# Patient Record
Sex: Female | Born: 1970 | Race: Black or African American | Hispanic: No | Marital: Single | Smoking: Never smoker
Health system: Southern US, Community
[De-identification: ages and names within clinical notes are randomized; demographics above are authoritative.]

## PROBLEM LIST (undated history)

## (undated) DIAGNOSIS — Z8619 Personal history of other infectious and parasitic diseases: Secondary | ICD-10-CM

## (undated) DIAGNOSIS — N760 Acute vaginitis: Secondary | ICD-10-CM

## (undated) DIAGNOSIS — R87619 Unspecified abnormal cytological findings in specimens from cervix uteri: Secondary | ICD-10-CM

## (undated) DIAGNOSIS — A5901 Trichomonal vulvovaginitis: Secondary | ICD-10-CM

## (undated) DIAGNOSIS — IMO0002 Reserved for concepts with insufficient information to code with codable children: Secondary | ICD-10-CM

## (undated) DIAGNOSIS — B9689 Other specified bacterial agents as the cause of diseases classified elsewhere: Secondary | ICD-10-CM

## (undated) HISTORY — DX: Acute vaginitis: N76.0

## (undated) HISTORY — DX: Trichomonal vulvovaginitis: A59.01

## (undated) HISTORY — DX: Other specified bacterial agents as the cause of diseases classified elsewhere: B96.89

## (undated) HISTORY — DX: Unspecified abnormal cytological findings in specimens from cervix uteri: R87.619

## (undated) HISTORY — DX: Reserved for concepts with insufficient information to code with codable children: IMO0002

## (undated) HISTORY — DX: Personal history of other infectious and parasitic diseases: Z86.19

---

## 1998-02-17 ENCOUNTER — Inpatient Hospital Stay (HOSPITAL_COMMUNITY): Admission: EM | Admit: 1998-02-17 | Discharge: 1998-02-17 | Payer: Self-pay | Admitting: *Deleted

## 1998-05-14 ENCOUNTER — Inpatient Hospital Stay (HOSPITAL_COMMUNITY): Admission: RE | Admit: 1998-05-14 | Discharge: 1998-05-14 | Payer: Self-pay | Admitting: *Deleted

## 1998-08-11 ENCOUNTER — Inpatient Hospital Stay (HOSPITAL_COMMUNITY): Admission: AD | Admit: 1998-08-11 | Discharge: 1998-08-11 | Payer: Self-pay | Admitting: *Deleted

## 1998-11-11 ENCOUNTER — Inpatient Hospital Stay (HOSPITAL_COMMUNITY): Admission: AD | Admit: 1998-11-11 | Discharge: 1998-11-11 | Payer: Self-pay | Admitting: *Deleted

## 1999-02-26 ENCOUNTER — Inpatient Hospital Stay (HOSPITAL_COMMUNITY): Admission: AD | Admit: 1999-02-26 | Discharge: 1999-02-26 | Payer: Self-pay | Admitting: *Deleted

## 1999-05-21 ENCOUNTER — Inpatient Hospital Stay (HOSPITAL_COMMUNITY): Admission: AD | Admit: 1999-05-21 | Discharge: 1999-05-21 | Payer: Self-pay | Admitting: *Deleted

## 1999-08-24 ENCOUNTER — Inpatient Hospital Stay (HOSPITAL_COMMUNITY): Admission: AD | Admit: 1999-08-24 | Discharge: 1999-08-24 | Payer: Self-pay | Admitting: *Deleted

## 2000-01-01 ENCOUNTER — Inpatient Hospital Stay (HOSPITAL_COMMUNITY): Admission: EM | Admit: 2000-01-01 | Discharge: 2000-01-01 | Payer: Self-pay | Admitting: *Deleted

## 2000-01-18 ENCOUNTER — Inpatient Hospital Stay (HOSPITAL_COMMUNITY): Admission: AD | Admit: 2000-01-18 | Discharge: 2000-01-18 | Payer: Self-pay | Admitting: *Deleted

## 2000-04-29 ENCOUNTER — Inpatient Hospital Stay (HOSPITAL_COMMUNITY): Admission: AD | Admit: 2000-04-29 | Discharge: 2000-04-29 | Payer: Self-pay | Admitting: *Deleted

## 2000-07-06 ENCOUNTER — Inpatient Hospital Stay (HOSPITAL_COMMUNITY): Admission: AD | Admit: 2000-07-06 | Discharge: 2000-07-06 | Payer: Self-pay | Admitting: *Deleted

## 2000-10-03 ENCOUNTER — Inpatient Hospital Stay (HOSPITAL_COMMUNITY): Admission: AD | Admit: 2000-10-03 | Discharge: 2000-10-03 | Payer: Self-pay | Admitting: *Deleted

## 2000-12-26 ENCOUNTER — Inpatient Hospital Stay (HOSPITAL_COMMUNITY): Admission: AD | Admit: 2000-12-26 | Discharge: 2000-12-26 | Payer: Self-pay | Admitting: *Deleted

## 2001-03-21 ENCOUNTER — Inpatient Hospital Stay (HOSPITAL_COMMUNITY): Admission: AD | Admit: 2001-03-21 | Discharge: 2001-03-21 | Payer: Self-pay | Admitting: *Deleted

## 2002-03-27 ENCOUNTER — Other Ambulatory Visit: Admission: RE | Admit: 2002-03-27 | Discharge: 2002-03-27 | Payer: Self-pay | Admitting: Obstetrics and Gynecology

## 2002-11-01 DIAGNOSIS — B9689 Other specified bacterial agents as the cause of diseases classified elsewhere: Secondary | ICD-10-CM

## 2002-11-01 HISTORY — DX: Other specified bacterial agents as the cause of diseases classified elsewhere: B96.89

## 2003-04-17 ENCOUNTER — Other Ambulatory Visit: Admission: RE | Admit: 2003-04-17 | Discharge: 2003-04-17 | Payer: Self-pay | Admitting: Obstetrics and Gynecology

## 2003-12-28 ENCOUNTER — Emergency Department (HOSPITAL_COMMUNITY): Admission: EM | Admit: 2003-12-28 | Discharge: 2003-12-28 | Payer: Self-pay | Admitting: Emergency Medicine

## 2004-03-31 ENCOUNTER — Emergency Department (HOSPITAL_COMMUNITY): Admission: EM | Admit: 2004-03-31 | Discharge: 2004-03-31 | Payer: Self-pay | Admitting: Emergency Medicine

## 2004-04-03 ENCOUNTER — Emergency Department (HOSPITAL_COMMUNITY): Admission: EM | Admit: 2004-04-03 | Discharge: 2004-04-03 | Payer: Self-pay | Admitting: Emergency Medicine

## 2004-11-01 DIAGNOSIS — A5901 Trichomonal vulvovaginitis: Secondary | ICD-10-CM

## 2004-11-01 HISTORY — DX: Trichomonal vulvovaginitis: A59.01

## 2004-11-05 ENCOUNTER — Other Ambulatory Visit: Admission: RE | Admit: 2004-11-05 | Discharge: 2004-11-05 | Payer: Self-pay | Admitting: Obstetrics and Gynecology

## 2005-11-16 ENCOUNTER — Other Ambulatory Visit: Admission: RE | Admit: 2005-11-16 | Discharge: 2005-11-16 | Payer: Self-pay | Admitting: Obstetrics and Gynecology

## 2006-04-08 ENCOUNTER — Emergency Department (HOSPITAL_COMMUNITY): Admission: EM | Admit: 2006-04-08 | Discharge: 2006-04-08 | Payer: Self-pay | Admitting: Emergency Medicine

## 2009-07-02 HISTORY — PX: SPHINCTEROTOMY: SHX5279

## 2009-07-06 ENCOUNTER — Ambulatory Visit: Payer: Self-pay | Admitting: Diagnostic Radiology

## 2009-07-06 ENCOUNTER — Emergency Department (HOSPITAL_BASED_OUTPATIENT_CLINIC_OR_DEPARTMENT_OTHER): Admission: EM | Admit: 2009-07-06 | Discharge: 2009-07-06 | Payer: Self-pay | Admitting: Emergency Medicine

## 2009-07-16 ENCOUNTER — Encounter: Payer: Self-pay | Admitting: Emergency Medicine

## 2009-07-16 ENCOUNTER — Ambulatory Visit: Payer: Self-pay | Admitting: Radiology

## 2009-07-16 ENCOUNTER — Inpatient Hospital Stay (HOSPITAL_COMMUNITY): Admission: AD | Admit: 2009-07-16 | Discharge: 2009-07-17 | Payer: Self-pay | Admitting: Gastroenterology

## 2010-01-22 ENCOUNTER — Ambulatory Visit: Payer: Self-pay | Admitting: Family Medicine

## 2010-01-22 LAB — CONVERTED CEMR LAB
Beta hcg, urine, semiquantitative: NEGATIVE
Ketones, urine, test strip: NEGATIVE
Nitrite: NEGATIVE
Protein, U semiquant: 30
Urobilinogen, UA: 1
WBC Urine, dipstick: NEGATIVE

## 2010-01-23 ENCOUNTER — Encounter: Payer: Self-pay | Admitting: Family Medicine

## 2010-01-26 ENCOUNTER — Telehealth: Payer: Self-pay | Admitting: Family Medicine

## 2010-12-01 NOTE — Progress Notes (Signed)
  Phone Note Call from Patient   Caller: Patient Reason for Call: Lab or Test Results Summary of Call: PATIENT HAS NOT HEARD ANYTHING FROM LABS. PLEASE CALL AT [phone]. [place]  Initial call taken by: Dannette Barbara,  January 26, 2010 2:16 PM     Notify wet prep negative; GC/chlamydia still pending Donna Christen MD  January 26, 2010 7:34 PM   Pt notified. Areta Haber CMA  January 26, 2010 7:39 PM

## 2010-12-01 NOTE — Assessment & Plan Note (Signed)
Summary: discharge, itching x 1 wk rm 3   Vital Signs:  Patient Profile:   40 Years Old Female CC:      irritation & discharge x 1 week Height:     69 inches Weight:      153 pounds O2 Sat:      100 % O2 treatment:    Room Air Temp:     98.4 degrees F oral Pulse rate:   78 / minute Pulse rhythm:   regular Resp:     16 per minute BP sitting:   98 / 75  (right arm) Cuff size:   regular  Vitals Entered By: Areta Haber CMA (January 22, 2010 5:27 PM)                  Current Allergies: No known allergies History of Present Illness Chief Complaint: irritation & discharge x 1 week History of Present Illness: Patient reports vaginal irritation and itching for about one week not associated with abdominal pain or urinary symptoms.  Reports she used OTC Monistat two days ago for symptoms without relief of symptoms.  Last unprotected intercourse two weeks ago.  No fever, n/v or known symptoms in partner.  Current Problems: VAGINITIS (ICD-616.10) FAMILY HISTORY OF ASTHMA (ICD-V17.5)   Current Meds DEPO-SUBQ PROVERA 104 104 MG/0.65ML SUSP (MEDROXYPROGESTERONE ACETATE) injection q 3 mths METROGEL-VAGINAL 0.75 % GEL (METRONIDAZOLE) 1 applicatorful inserted in vagina two times a day for five days AZITHROMYCIN 1 GM PACK (AZITHROMYCIN) take all tabs-single dose SUPRAX 400 MG TABS (CEFIXIME) take one tab by mouth x1  REVIEW OF SYSTEMS Constitutional Symptoms      Denies fever, chills, night sweats, weight loss, weight gain, and fatigue.  Eyes       Denies change in vision, eye pain, eye discharge, glasses, contact lenses, and eye surgery. Ear/Nose/Throat/Mouth       Denies hearing loss/aids, change in hearing, ear pain, ear discharge, dizziness, frequent runny nose, frequent nose bleeds, sinus problems, sore throat, hoarseness, and tooth pain or bleeding.  Respiratory       Denies dry cough, productive cough, wheezing, shortness of breath, asthma, bronchitis, and emphysema/COPD.    Cardiovascular       Denies murmurs, chest pain, and tires easily with exhertion.    Gastrointestinal       Denies stomach pain, nausea/vomiting, diarrhea, constipation, blood in bowel movements, and indigestion. Genitourniary       Complains of blood or discharge from vagina.      Denies painful urination, kidney stones, and loss of urinary control.      Comments: no odor x 1 wk Neurological       Denies paralysis, seizures, and fainting/blackouts. Musculoskeletal       Denies muscle pain, joint pain, joint stiffness, decreased range of motion, redness, swelling, muscle weakness, and gout.  Skin       Denies bruising, unusual mles/lumps or sores, and hair/skin or nail changes.  Psych       Denies mood changes, temper/anger issues, anxiety/stress, speech problems, depression, and sleep problems.  Past History:  Past Medical History: Unremarkable  Past Surgical History: Denies surgical history  Family History:   Family History of Asthma  Social History: Single Never Smoked Alcohol use-no Drug use-no Regular exercise-yes Smoking Status:  never Drug Use:  no Does Patient Exercise:  yes Physical Exam General appearance: well developed, well nourished, no acute distress Oral/Pharynx: tongue normal, posterior pharynx without erythema or exudate Neck: neck supple,  trachea midline,  no masses or lymphadenopathy Chest/Lungs: no rales, wheezes, or rhonchi bilateral, breath sounds equal without effort Heart: regular rate and  rhythm, no murmur Abdomen: soft, non-tender without obvious organomegaly GU: normal , no cva tenderness Skin: no obvious rashes or lesions pelvic exam with specimens sent to lab, small to moderate amount of thin white discharge, no cervical or adnexal tenderness, vaginal introitus with mild irritation. Assessment New Problems: VAGINITIS (ICD-616.10) FAMILY HISTORY OF ASTHMA (ICD-V17.5)   Plan New Medications/Changes: AZITHROMYCIN 1 GM PACK  (AZITHROMYCIN) take all tabs-single dose  #1 x 0, 01/22/2010, Acelin Ferdig DO SUPRAX 400 MG TABS (CEFIXIME) take one tab by mouth x1  #1 x 0, 01/22/2010, Marvis Moeller DO METROGEL-VAGINAL 0.75 % GEL (METRONIDAZOLE) 1 applicatorful inserted in vagina two times a day for five days  #t tube x 0, 01/22/2010, Marvis Moeller DO  New Orders: New Patient Level III [99203] T- GC Chlamydia [16109] T-Wet Prep by [facility] [60454-09811] Urine Pregnancy Test  [81025] Urinalysis [81003-65000]   Prescriptions: AZITHROMYCIN 1 GM PACK (AZITHROMYCIN) take all tabs-single dose  #1 x 0   Entered and Authorized by:   Marvis Moeller DO   Signed by:   Marvis Moeller DO on 01/22/2010   Method used:   Print then Give to Patient   RxID:   [id number] SUPRAX 400 MG TABS (CEFIXIME) take one tab by mouth x1  #1 x 0   Entered and Authorized by:   Marvis Moeller DO   Signed by:   Marvis Moeller DO on 01/22/2010   Method used:   Print then Give to Patient   RxID:   [id number] METROGEL-VAGINAL 0.75 % GEL (METRONIDAZOLE) 1 applicatorful inserted in vagina two times a day for five days  #t tube x 0   Entered and Authorized by:   Marvis Moeller DO   Signed by:   Marvis Moeller DO on 01/22/2010   Method used:   Print then Give to Patient   RxID:   [id number]   Patient Instructions: 1)  Take medications as prescribed. 2)  Abstain from intercourse for seven days. 3)  Await results of cultures. 4)  Use condoms for STD prevention. 5)  Return if symptoms worsen or persist with treatment.  Laboratory Results   Urine Tests  Date/Time Received: January 22, 2010 5:54 PM  Date/Time Reported: January 22, 2010 5:55 PM   Routine Urinalysis   Color: yellow Appearance: Clear Glucose: negative   (Normal Range: Negative) Bilirubin: negative   (Normal Range: Negative) Ketone: negative   (Normal Range: Negative) Spec. Gravity: 1.020   (Normal Range: 1.003-1.035) Blood: negative   (Normal  Range: Negative) pH: 7.0   (Normal Range: 5.0-8.0) Protein: 30   (Normal Range: Negative) Urobilinogen: 1.0   (Normal Range: 0-1) Nitrite: negative   (Normal Range: Negative) Leukocyte Esterace: negative   (Normal Range: Negative)    Urine HCG: negative

## 2011-02-05 LAB — COMPREHENSIVE METABOLIC PANEL
ALT: 242 U/L — ABNORMAL HIGH (ref 0–35)
AST: 85 U/L — ABNORMAL HIGH (ref 0–37)
AST: 142 U/L — ABNORMAL HIGH (ref 0–37)
Albumin: 3.4 g/dL — ABNORMAL LOW (ref 3.5–5.2)
Albumin: 4.3 g/dL (ref 3.5–5.2)
Alkaline Phosphatase: 168 U/L — ABNORMAL HIGH (ref 39–117)
Alkaline Phosphatase: 204 U/L — ABNORMAL HIGH (ref 39–117)
BUN: 3 mg/dL — ABNORMAL LOW (ref 6–23)
BUN: 14 mg/dL (ref 6–23)
BUN: 9 mg/dL (ref 6–23)
CO2: 30 mEq/L (ref 19–32)
Calcium: 9.4 mg/dL (ref 8.4–10.5)
Chloride: 105 mEq/L (ref 96–112)
Chloride: 106 mEq/L (ref 96–112)
Creatinine, Ser: 0.72 mg/dL (ref 0.4–1.2)
GFR calc Af Amer: >60 mL/min (ref >60)
GFR calc non Af Amer: >60 mL/min (ref >60)
Glucose, Bld: 85 mg/dL (ref 70–99)
Potassium: 3.6 mEq/L (ref 3.5–5.1)
Potassium: 3.7 mEq/L (ref 3.5–5.1)
Sodium: 144 mEq/L (ref 135–145)
Total Bilirubin: 1.3 mg/dL — ABNORMAL HIGH (ref 0.3–1.2)
Total Bilirubin: 1.5 mg/dL — ABNORMAL HIGH (ref 0.3–1.2)
Total Protein: 6.7 g/dL (ref 6.0–8.3)
Total Protein: 8.3 g/dL (ref 6.0–8.3)

## 2011-02-05 LAB — DIFFERENTIAL
Basophils Absolute: 0 10*3/uL (ref 0.0–0.1)
Basophils Absolute: 0 10*3/uL (ref 0.0–0.1)
Eosinophils Absolute: 0.1 10*3/uL (ref 0.0–0.7)
Eosinophils Absolute: 0.1 10*3/uL (ref 0.0–0.7)
Lymphocytes Relative: 34 % (ref 12–46)
Lymphs Abs: 2.8 10*3/uL (ref 0.7–4.0)
Lymphs Abs: 3.7 10*3/uL (ref 0.7–4.0)
Monocytes Absolute: 1.1 10*3/uL — ABNORMAL HIGH (ref 0.1–1.0)
Neutro Abs: 4.6 10*3/uL (ref 1.7–7.7)
Neutro Abs: 6 10*3/uL (ref 1.7–7.7)

## 2011-02-05 LAB — URINALYSIS, ROUTINE W REFLEX MICROSCOPIC
Glucose, UA: NEGATIVE mg/dL
Hgb urine dipstick: NEGATIVE
Hgb urine dipstick: NEGATIVE
Ketones, ur: 15 mg/dL — AB
Ketones, ur: 15 mg/dL — AB
Nitrite: NEGATIVE
Protein, ur: NEGATIVE mg/dL
Specific Gravity, Urine: 1.039 — ABNORMAL HIGH (ref 1.005–1.030)
Urobilinogen, UA: 2 mg/dL — ABNORMAL HIGH (ref 0.0–1.0)
Urobilinogen, UA: 2 mg/dL — ABNORMAL HIGH (ref 0.0–1.0)
pH: 5.5 (ref 5.0–8.0)

## 2011-02-05 LAB — CBC
HCT: 35 % — ABNORMAL LOW (ref 36.0–46.0)
HCT: 36.8 % (ref 36.0–46.0)
HCT: 38.7 % (ref 36.0–46.0)
Hemoglobin: 12.3 g/dL (ref 12.0–15.0)
MCHC: 32.2 g/dL (ref 30.0–36.0)
MCHC: 31.9 g/dL (ref 30.0–36.0)
MCV: 71.7 fL — ABNORMAL LOW (ref 78.0–100.0)
Platelets: 136 10*3/uL — ABNORMAL LOW (ref 150–400)
Platelets: 185 10*3/uL (ref 150–400)
Platelets: 216 10*3/uL (ref 150–400)
RBC: 5.09 MIL/uL (ref 3.87–5.11)
RDW: 15.3 % (ref 11.5–15.5)
RDW: 14.6 % (ref 11.5–15.5)
WBC: 5.2 10*3/uL (ref 4.0–10.5)
WBC: 8.2 10*3/uL (ref 4.0–10.5)

## 2011-02-05 LAB — POCT CARDIAC MARKERS

## 2011-02-05 LAB — URINE MICROSCOPIC-ADD ON

## 2011-02-05 LAB — LIPASE, BLOOD: Lipase: 121 U/L (ref 23–300)

## 2011-02-05 LAB — PREGNANCY, URINE: Preg Test, Ur: NEGATIVE

## 2011-06-05 ENCOUNTER — Encounter: Payer: Self-pay | Admitting: Family Medicine

## 2011-06-07 ENCOUNTER — Ambulatory Visit: Payer: Self-pay | Admitting: Family Medicine

## 2011-06-07 DIAGNOSIS — Z0289 Encounter for other administrative examinations: Secondary | ICD-10-CM

## 2011-10-21 ENCOUNTER — Encounter: Payer: Self-pay | Admitting: *Deleted

## 2011-10-21 ENCOUNTER — Emergency Department (INDEPENDENT_AMBULATORY_CARE_PROVIDER_SITE_OTHER)
Admission: EM | Admit: 2011-10-21 | Discharge: 2011-10-21 | Disposition: A | Discharge status: Home / Self Care | Payer: BC Managed Care – PPO | Source: Home / Self Care | Attending: Emergency Medicine | Admitting: Emergency Medicine

## 2011-10-21 DIAGNOSIS — J209 Acute bronchitis, unspecified: Secondary | ICD-10-CM

## 2011-10-21 DIAGNOSIS — R05 Cough: Secondary | ICD-10-CM

## 2011-10-21 DIAGNOSIS — R059 Cough, unspecified: Secondary | ICD-10-CM

## 2011-10-21 MED ORDER — AZITHROMYCIN 250 MG PO TABS: ORAL_TABLET | ORAL | Status: AC

## 2011-10-21 MED ORDER — GUAIFENESIN-CODEINE 100-10 MG/5ML PO SYRP: 5.0000 mL | ORAL_SOLUTION | Freq: Four times a day (QID) | ORAL | Status: AC | PRN

## 2011-10-21 NOTE — ED Provider Notes (Signed)
History     CSN: [id number]  Arrival date & time 10/21/11  1004   First MD Initiated Contact with Patient 10/21/11 1009      Chief Complaint  Patient presents with  . Cough    (Consider location/radiation/quality/duration/timing/severity/associated sxs/prior treatment) HPI Heidi Anderson is a 40 y.o. female who complains of onset of cold symptoms for  a week .  No sore throat ++ cough No pleuritic pain No wheezing + nasal congestion + post-nasal drainage + sinus pain/pressure No chest congestion + itchy/red eyes No earache No hemoptysis No SOB No chills/sweats No fever No nausea No vomiting No abdominal pain No diarrhea No skin rashes No fatigue No myalgias No headache    History reviewed. No pertinent past medical history.  Past Surgical History  Procedure Date  . Cesarean section     Family History  Problem Relation Age of Onset  . Asthma      family history    History  Substance Use Topics  . Smoking status: Never Smoker   . Smokeless tobacco: Not on file  . Alcohol Use: No    OB History    Grav Para Term Preterm Abortions TAB SAB Ect Mult Living                  Review of Systems  Allergies  Review of patient's allergies indicates no known allergies.  Home Medications   Current Outpatient Rx  Name Route Sig Dispense Refill  . AZITHROMYCIN 250 MG PO TABS  Use as directed 1 each 0  . GUAIFENESIN-CODEINE 100-10 MG/5ML PO SYRP Oral Take 5 mLs by mouth 4 (four) times daily as needed for cough or congestion. 120 mL 0  . MEDROXYPROGESTERONE ACETATE 104 MG/0.65ML [place] SUSP Subcutaneous Inject 104 mg into the skin every 3 (three) months.        BP 112/72  Pulse 90  Temp(Src) 98.1 F (36.7 C) (Oral)  Resp 14  Ht 5\' 9"  (1.753 m)  Wt 165 lb (74.844 kg)  BMI 24.37 kg/m2  SpO2 97%  Physical Exam  Nursing note and vitals reviewed. Constitutional: She is oriented to person, place, and time. She appears well-developed and well-nourished. She  appears distressed (actively coughing throughout the exam).  HENT:  Head: Normocephalic and atraumatic.  Right Ear: Tympanic membrane, external ear and ear canal normal.  Left Ear: Tympanic membrane, external ear and ear canal normal.  Nose: Mucosal edema and rhinorrhea present.  Mouth/Throat: Posterior oropharyngeal erythema present. No oropharyngeal exudate or posterior oropharyngeal edema.  Eyes: No scleral icterus.  Neck: Neck supple.  Cardiovascular: Regular rhythm and normal heart sounds.   Pulmonary/Chest: Effort normal and breath sounds normal. No respiratory distress.  Neurological: She is alert and oriented to person, place, and time.  Skin: Skin is warm and dry.  Psychiatric: She has a normal mood and affect. Her speech is normal.    ED Course  Procedures (including critical care time)  Labs Reviewed - No data to display No results found.   1. Cough   2. Acute bronchitis       MDM  1)  Take the prescribed antibiotic as instructed.  If not improving, consider Tessalon Perles. 2)  Use nasal saline solution (over the counter) at least 3 times a day. 3)  Use over the counter decongestants like Zyrtec-D every 12 hours as needed to help with congestion.  If you have hypertension, do not take medicines with sudafed.  4)  Can take tylenol every 6  hours or motrin every 8 hours for pain or fever. 5)  Follow up with your primary doctor if no improvement in 5-7 days, sooner if increasing pain, fever, or new symptoms.      Lily Kocher, MD 10/21/11 [phone]

## 2011-10-21 NOTE — ED Notes (Signed)
Patient c/o dry cough, mildly productive @ xs. She has used Catering manager and delsym OTC. Denies fever.

## 2012-02-11 ENCOUNTER — Other Ambulatory Visit (INDEPENDENT_AMBULATORY_CARE_PROVIDER_SITE_OTHER): Payer: BC Managed Care – PPO

## 2012-02-11 DIAGNOSIS — Z3009 Encounter for other general counseling and advice on contraception: Secondary | ICD-10-CM

## 2012-02-11 MED ORDER — MEDROXYPROGESTERONE ACETATE 150 MG/ML IM SUSP
150.0000 mg | Freq: Once | INTRAMUSCULAR | Status: AC
  Administered 2012-02-11: 150 mg via INTRAMUSCULAR

## 2012-02-11 NOTE — Progress Notes (Signed)
Next Depo Due 05-05-12

## 2012-02-14 ENCOUNTER — Other Ambulatory Visit: Payer: Self-pay

## 2012-05-05 ENCOUNTER — Other Ambulatory Visit (INDEPENDENT_AMBULATORY_CARE_PROVIDER_SITE_OTHER): Payer: BC Managed Care – PPO

## 2012-05-05 DIAGNOSIS — Z3040 Encounter for surveillance of contraceptives, unspecified: Secondary | ICD-10-CM

## 2012-05-05 MED ORDER — MEDROXYPROGESTERONE ACETATE 150 MG/ML IM SUSP
150.0000 mg | Freq: Once | INTRAMUSCULAR | Status: AC
  Administered 2012-05-05: 150 mg via INTRAMUSCULAR

## 2012-05-05 NOTE — Progress Notes (Unsigned)
Next depo due 07/27/2012

## 2012-07-24 ENCOUNTER — Other Ambulatory Visit (INDEPENDENT_AMBULATORY_CARE_PROVIDER_SITE_OTHER): Payer: BC Managed Care – PPO

## 2012-07-24 DIAGNOSIS — Z3009 Encounter for other general counseling and advice on contraception: Secondary | ICD-10-CM

## 2012-07-24 MED ORDER — MEDROXYPROGESTERONE ACETATE 150 MG/ML IM SUSP
150.0000 mg | Freq: Once | INTRAMUSCULAR | Status: AC
  Administered 2012-07-24: 150 mg via INTRAMUSCULAR

## 2012-07-24 NOTE — Progress Notes (Unsigned)
Next Depo due 10-15-2012 

## 2012-07-27 ENCOUNTER — Other Ambulatory Visit: Payer: BC Managed Care – PPO

## 2012-10-03 ENCOUNTER — Telehealth: Payer: Self-pay

## 2012-10-03 NOTE — Telephone Encounter (Signed)
Per protocol, Depo Provera 150 mg/ml x 1 called to [facility]. Pt is due for injection on 10/15/2012. Pt to bring inj. To office at time of AEX. Melody Comas A

## 2012-10-09 ENCOUNTER — Emergency Department (INDEPENDENT_AMBULATORY_CARE_PROVIDER_SITE_OTHER)
Admission: EM | Admit: 2012-10-09 | Discharge: 2012-10-09 | Disposition: A | Discharge status: Home / Self Care | Payer: BC Managed Care – PPO | Source: Home / Self Care | Attending: Family Medicine | Admitting: Family Medicine

## 2012-10-09 ENCOUNTER — Other Ambulatory Visit: Payer: Self-pay | Admitting: Family Medicine

## 2012-10-09 ENCOUNTER — Encounter: Payer: Self-pay | Admitting: *Deleted

## 2012-10-09 DIAGNOSIS — N898 Other specified noninflammatory disorders of vagina: Secondary | ICD-10-CM

## 2012-10-09 DIAGNOSIS — B9689 Other specified bacterial agents as the cause of diseases classified elsewhere: Secondary | ICD-10-CM

## 2012-10-09 MED ORDER — METRONIDAZOLE 500 MG PO TABS: 500.0000 mg | ORAL_TABLET | Freq: Two times a day (BID) | ORAL | Status: DC

## 2012-10-09 MED ORDER — FLUCONAZOLE 150 MG PO TABS: 150.0000 mg | ORAL_TABLET | Freq: Once | ORAL | Status: DC

## 2012-10-09 NOTE — ED Notes (Signed)
Pt c/o vaginal discharge and foul smell x 2 days. No OTC meds.

## 2012-10-09 NOTE — ED Provider Notes (Signed)
History     CSN: [id number]  Arrival date & time 10/09/12  1729   First MD Initiated Contact with Patient 10/09/12 1733      Chief Complaint  Patient presents with  . Vaginal Discharge   HPI VAGINAL DISCHARGE Onset: 2 days  Description: foul smelling vaginal discharge Modifying factors: prior hx/o BV. Similar presentation  Symptoms Odor: yes Itching: no Vaginal burning: mild Dysuria: no Bleeding: no Pelvic pain:no  Back pain: no Fever: no Genital sores: no Rash: no Dyspareunia: no GI Symptoms: no  Red Flags:  Missed period: no Recent antibiotics: no Possible STD exposure: no IUD: no Diabetes: no   Past Medical History  Diagnosis Date  . H/O varicella   . Abnormal Pap smear     cells frozen  . BV (bacterial vaginosis) 2004  . Trichomonas vaginitis 2006    Past Surgical History  Procedure Date  . Sphincterotomy 07/2009  . Cesarean section     x 1    Family History  Problem Relation Age of Onset  . Asthma      family history    History  Substance Use Topics  . Smoking status: Never Smoker   . Smokeless tobacco: Not on file  . Alcohol Use: No    OB History    Grav Para Term Preterm Abortions TAB SAB Ect Mult Living   3 1 1  2     1       Review of Systems  All other systems reviewed and are negative.    Allergies  Review of patient's allergies indicates no known allergies.  Home Medications   Current Outpatient Rx  Name  Route  Sig  Dispense  Refill  . FLUCONAZOLE 150 MG PO TABS   Oral   Take 1 tablet (150 mg total) by mouth once.   1 tablet   0   . MEDROXYPROGESTERONE ACETATE 104 MG/0.65ML [place] SUSP   Subcutaneous   Inject 104 mg into the skin every 3 (three) months.           Marland Kitchen METRONIDAZOLE 500 MG PO TABS   Oral   Take 1 tablet (500 mg total) by mouth 2 (two) times daily.   14 tablet   0     BP 118/77  Pulse 94  Temp 98.3 F (36.8 C) (Oral)  Resp 16  Wt 167 lb (75.751 kg)  SpO2 100%  Physical Exam   Constitutional: She appears well-developed and well-nourished.  HENT:  Head: Normocephalic and atraumatic.  Eyes: Conjunctivae normal are normal. Pupils are equal, round, and reactive to light.  Neck: Normal range of motion. Neck supple.  Cardiovascular: Normal rate, regular rhythm and normal heart sounds.   Pulmonary/Chest: Effort normal and breath sounds normal.  Abdominal: Soft. Bowel sounds are normal.  Musculoskeletal: Normal range of motion.  Neurological: She is alert.  Skin: Skin is warm.    ED Course  Procedures (including critical care time)  Labs Reviewed - No data to display No results found.   1. Vaginal discharge   2. BV (bacterial vaginosis)       MDM  Will treat for BV with flagyl.  Diflucan for yeast coverage. Will check wet prep, GC, Chl.  Discussed general care.  BV handout given.  Follow up as needed.      The patient and/or caregiver has been counseled thoroughly with regard to treatment plan and/or medications prescribed including dosage, schedule, interactions, rationale for use, and possible side effects and they  verbalize understanding. Diagnoses and expected course of recovery discussed and will return if not improved as expected or if the condition worsens. Patient and/or caregiver verbalized understanding.             Doree Albee, MD 10/09/12 Paulo Fruit

## 2012-10-10 ENCOUNTER — Telehealth: Payer: Self-pay | Admitting: Emergency Medicine

## 2012-10-10 LAB — GC/CHLAMYDIA PROBE AMP, URINE
Chlamydia, Swab/Urine, PCR: NEGATIVE
GC Probe Amp, Urine: NEGATIVE

## 2012-10-10 LAB — WET PREP BY MOLECULAR PROBE
Gardnerella vaginalis: POSITIVE — AB
Trichomonas vaginosis: NEGATIVE

## 2012-10-10 MED ORDER — METRONIDAZOLE 0.75 % VA GEL: 1.0000 | Freq: Two times a day (BID) | VAGINAL | Status: AC

## 2012-10-16 ENCOUNTER — Other Ambulatory Visit: Payer: BC Managed Care – PPO

## 2012-10-16 ENCOUNTER — Ambulatory Visit: Payer: BC Managed Care – PPO | Admitting: Obstetrics and Gynecology

## 2012-10-16 MED ORDER — MEDROXYPROGESTERONE ACETATE 150 MG/ML IM SUSP
150.0000 mg | Freq: Once | INTRAMUSCULAR | Status: AC
  Administered 2012-10-16: 150 mg via INTRAMUSCULAR

## 2012-10-16 NOTE — Progress Notes (Signed)
Next Depo due January 07, 2013  ld

## 2013-01-01 ENCOUNTER — Other Ambulatory Visit: Payer: BC Managed Care – PPO

## 2013-01-01 ENCOUNTER — Ambulatory Visit: Payer: BC Managed Care – PPO | Admitting: Obstetrics and Gynecology

## 2013-01-01 ENCOUNTER — Telehealth: Payer: Self-pay | Admitting: Obstetrics and Gynecology

## 2013-01-01 NOTE — Telephone Encounter (Signed)
VM from pt and also fax for Rf Depo. Annual R/S to 3/10814. Per protocol, Rx for Depo Provera 150 ml Im x 1 [facility]. Pt notified.

## 2013-01-08 ENCOUNTER — Encounter: Payer: Self-pay | Admitting: Obstetrics and Gynecology

## 2013-01-08 ENCOUNTER — Other Ambulatory Visit: Payer: BC Managed Care – PPO

## 2013-01-08 ENCOUNTER — Ambulatory Visit: Payer: BC Managed Care – PPO | Admitting: Obstetrics and Gynecology

## 2013-01-08 VITALS — BP 100/62 | HR 68 | Ht 69.5 in | Wt 168.0 lb

## 2013-01-08 DIAGNOSIS — Z3009 Encounter for other general counseling and advice on contraception: Secondary | ICD-10-CM

## 2013-01-08 DIAGNOSIS — Z124 Encounter for screening for malignant neoplasm of cervix: Secondary | ICD-10-CM

## 2013-01-08 DIAGNOSIS — Z01419 Encounter for gynecological examination (general) (routine) without abnormal findings: Secondary | ICD-10-CM

## 2013-01-08 MED ORDER — MEDROXYPROGESTERONE ACETATE 104 MG/0.65ML ~~LOC~~ SUSP: 104.0000 mg | SUBCUTANEOUS | Status: DC

## 2013-01-08 MED ORDER — MEDROXYPROGESTERONE ACETATE 150 MG/ML IM SUSP: 150.0000 mg | Freq: Once | INTRAMUSCULAR | Status: DC

## 2013-01-08 MED ORDER — MEDROXYPROGESTERONE ACETATE 150 MG/ML IM SUSP
150.0000 mg | Freq: Once | INTRAMUSCULAR | Status: AC
  Administered 2013-01-08: 150 mg via INTRAMUSCULAR

## 2013-01-08 NOTE — Progress Notes (Signed)
Subjective:    Heidi Anderson is a 42 y.o. female, [id number], who presents for an annual exam. The patient reports no complaints.   Menstrual cycle:   LMP: No LMP recorded. Patient has had an injection.             Review of Systems Pertinent items are noted in HPI. Denies pelvic pain, urinary tract symptoms, vaginitis symptoms, irregular bleeding, menopausal symptoms, change in bowel habits or rectal bleeding   Objective:    BP 100/62  Pulse 68  Ht 5' 9.5" (1.765 m)  Wt 168 lb (76.204 kg)  BMI 24.46 kg/m2   Wt Readings from Last 1 Encounters:  01/08/13 168 lb (76.204 kg)   Body mass index is 24.46 kg/(m^2). General Appearance: Alert, no acute distress HEENT: Grossly normal Neck / Thyroid: Supple, no thyromegaly or cervical adenopathy Lungs: Clear to auscultation bilaterally Back: No CVA tenderness Breast Exam: No masses or nodes.No dimpling, nipple retraction or discharge. Cardiovascular: Regular rate and rhythm.  Gastrointestinal: Soft, non-tender, no masses or organomegaly Pelvic Exam: EGBUS-wnl, vagina-normal rugae, cervix- without lesions or tenderness, uterus appears normal size shape and consistency, adnexae-no masses or tenderness Lymphatic Exam: Non-palpable nodes in neck, clavicular,  axillary, or inguinal regions  Skin: no rashes or abnormalities Extremities: no clubbing cyanosis or edema  Neurologic: grossly normal Psychiatric: Alert and oriented    Assessment:   Routine GYN Exam   Plan:   Depo Provera 104 sub Q  mg #1 bring to office for injection 4 refills  PAP sent  RTO 1 year or prn  POWELL,ELMIRAPA-C

## 2013-01-08 NOTE — Addendum Note (Signed)
Addended byWinfred Leeds on: 01/08/2013 12:15 PM   Modules accepted: Orders, Medications

## 2013-01-08 NOTE — Progress Notes (Unsigned)
Next Depo due 04-01-2013

## 2013-01-08 NOTE — Progress Notes (Signed)
Regular Periods: no depo provera  Mammogram: no  Monthly Breast Ex.: no Exercise: yes  Tetanus < 10 years: yes Seatbelts: yes  NI. Bladder Functn.: yes Abuse at home: no  Daily BM's: yes Stressful Work: no  Healthy Diet: yes Sigmoid-Colonoscopy: no  Calcium: no Medical problems this year: none   LAST PAP:10/12  Contraception: depo provera  Mammogram:  NO  PCP: NO  PMH: NO CHANGE  FMH: NO CHANGE  Last Bone Scan: LONG TIME  PT IS SINGLE

## 2013-01-09 LAB — PAP IG W/ RFLX HPV ASCU

## 2013-09-05 ENCOUNTER — Encounter: Payer: Self-pay | Admitting: Emergency Medicine

## 2013-09-05 ENCOUNTER — Emergency Department (INDEPENDENT_AMBULATORY_CARE_PROVIDER_SITE_OTHER)
Admission: EM | Admit: 2013-09-05 | Discharge: 2013-09-05 | Disposition: A | Discharge status: Home / Self Care | Payer: BC Managed Care – PPO | Source: Home / Self Care | Attending: Family Medicine | Admitting: Family Medicine

## 2013-09-05 DIAGNOSIS — L299 Pruritus, unspecified: Secondary | ICD-10-CM

## 2013-09-05 MED ORDER — HYDROXYZINE HCL 25 MG PO TABS: ORAL_TABLET | ORAL | Status: DC

## 2013-09-05 MED ORDER — PREDNISONE 20 MG PO TABS: 20.0000 mg | ORAL_TABLET | Freq: Two times a day (BID) | ORAL | Status: DC

## 2013-09-05 NOTE — ED Provider Notes (Signed)
CSN: [id number]     Arrival date & time 09/05/13  1102 History   First MD Initiated Contact with Patient 09/05/13 1118     Chief Complaint  Patient presents with  . Pruritis      HPI Comments: Patient complains of one month history of generalized puruitus without rash.  No lesions in mouth.  No fevers, chills, and sweats.  She feels well otherwise.   No recent new meds.   She showers with unscented Dove soap.                 The history is provided by the patient.    Past Medical History  Diagnosis Date  . H/O varicella   . Abnormal Pap smear     cells frozen  . BV (bacterial vaginosis) 2004  . Trichomonas vaginitis 2006   Past Surgical History  Procedure Laterality Date  . Sphincterotomy  07/2009  . Cesarean section      x 1   Family History  Problem Relation Age of Onset  . Asthma Mother   . Hypertension Other    History  Substance Use Topics  . Smoking status: Never Smoker   . Smokeless tobacco: Never Used  . Alcohol Use: No   OB History   Grav Para Term Preterm Abortions TAB SAB Ect Mult Living   3 1 1  2     1      Review of Systems  Constitutional: Negative.   HENT: Negative.   Eyes: Negative.   Respiratory: Negative.   Cardiovascular: Negative.   Gastrointestinal: Negative.   Endocrine: Negative.   Genitourinary: Negative.   Musculoskeletal: Negative.   Skin: Negative for color change, rash and wound.  Allergic/Immunologic: Negative.   Neurological: Negative.   Hematological: Negative.   Psychiatric/Behavioral: Negative.  Negative for agitation.    Allergies  Review of patient's allergies indicates no known allergies.  Home Medications   Current Outpatient Rx  Name  Route  Sig  Dispense  Refill  . hydrOXYzine (ATARAX/VISTARIL) 25 MG tablet      Take one or two tabs by mouth every 6 to 8 hours as needed for itching   20 tablet   1   . medroxyPROGESTERone (DEPO-PROVERA) 150 MG/ML injection   Intramuscular   Inject 1 mL (150 mg total) into  the muscle once.   1 mL   5   . predniSONE (DELTASONE) 20 MG tablet   Oral   Take 1 tablet (20 mg total) by mouth 2 (two) times daily.   10 tablet   0    BP 136/72  Pulse 83  Temp(Src) 97.9 F (36.6 C) (Oral)  Resp 18  Wt 167 lb (75.751 kg)  SpO2 100% Physical Exam Nursing notes and Vital Signs reviewed. Appearance:  Patient appears healthy, stated age, and in no acute distress Eyes:  Pupils are equal, round, and reactive to light and accomodation.  Extraocular movement is intact.  Conjunctivae are not inflamed  Ears:  Canals normal.  Tympanic membranes normal.  Nose:  Normal turbinates.  No sinus tenderness.    Mouth/Pharynx:  Normal Neck:  Supple.  No adenopathy or thyromegaly Lungs:  Clear to auscultation.  Breath sounds are equal.  Heart:  Regular rate and rhythm without murmurs, rubs, or gallops.  Abdomen:  Nontender without masses or hepatosplenomegaly.  Bowel sounds are present.  No CVA or flank tenderness.  Extremities:  No edema.  No calf tenderness Skin:  No rash present.  ED Course  Procedures  none    Labs Reviewed  CBC WITH DIFFERENTIAL  COMPLETE METABOLIC PANEL WITH GFR  TSH  HIV ANTIBODY (ROUTINE TESTING)         MDM   1. Pruritus ? etiology    Check screening CBC/diff, TSH, CMP, TSH.  Patient declines chest X-ray Empiric prednisone burst.  Vistaril for itching prn Discussed proper skin care:  Minimize baths/showers.  Use a mild bath soap containing oil such as unscented Dove.  Apply a moisturizing cream or lotion immediately after bathing while still wet, then towel dry.  Followup with Family Doctor.    Lattie Haw, MD 09/05/13 [phone]

## 2013-09-05 NOTE — ED Notes (Signed)
Pt c/o itching all over x 1 month. She reports increased stress and she is going to be scheduling cholectomy soon.

## 2013-09-06 LAB — CBC WITH DIFFERENTIAL/PLATELET
Basophils Absolute: 0 10*3/uL (ref 0.0–0.1)
HCT: 39.5 % (ref 36.0–46.0)
Lymphocytes Relative: 47 % — ABNORMAL HIGH (ref 12–46)
Lymphs Abs: 3.5 10*3/uL (ref 0.7–4.0)
Neutro Abs: 3.2 10*3/uL (ref 1.7–7.7)
Platelets: 158 10*3/uL (ref 150–400)
RBC: 5.68 MIL/uL — ABNORMAL HIGH (ref 3.87–5.11)
RDW: 17.2 % — ABNORMAL HIGH (ref 11.5–15.5)
WBC: 7.4 10*3/uL (ref 4.0–10.5)

## 2013-09-06 LAB — COMPLETE METABOLIC PANEL WITH GFR
ALT: 96 U/L — ABNORMAL HIGH (ref 0–35)
AST: 91 U/L — ABNORMAL HIGH (ref 0–37)
Albumin: 3.9 g/dL (ref 3.5–5.2)
Calcium: 9.7 mg/dL (ref 8.4–10.5)
Chloride: 106 mEq/L (ref 96–112)
Potassium: 4.1 mEq/L (ref 3.5–5.3)
Total Protein: 7.1 g/dL (ref 6.0–8.3)

## 2013-09-06 LAB — HIV ANTIBODY (ROUTINE TESTING W REFLEX): HIV: NONREACTIVE

## 2013-09-06 LAB — TSH: TSH: 2.207 u[IU]/mL (ref 0.350–4.500)

## 2013-09-07 ENCOUNTER — Telehealth: Payer: Self-pay | Admitting: *Deleted

## 2014-03-04 ENCOUNTER — Other Ambulatory Visit: Payer: Self-pay | Admitting: Obstetrics and Gynecology

## 2014-03-04 DIAGNOSIS — Z1231 Encounter for screening mammogram for malignant neoplasm of breast: Secondary | ICD-10-CM

## 2014-03-11 ENCOUNTER — Ambulatory Visit: Payer: BC Managed Care – PPO

## 2014-09-02 ENCOUNTER — Encounter: Payer: Self-pay | Admitting: Emergency Medicine

## 2015-05-20 ENCOUNTER — Emergency Department
Admission: EM | Admit: 2015-05-20 | Discharge: 2015-05-20 | Disposition: A | Discharge status: Home / Self Care | Payer: Federal, State, Local not specified - PPO | Source: Home / Self Care | Attending: Family Medicine | Admitting: Family Medicine

## 2015-05-20 ENCOUNTER — Encounter: Payer: Self-pay | Admitting: *Deleted

## 2015-05-20 DIAGNOSIS — B3731 Acute candidiasis of vulva and vagina: Secondary | ICD-10-CM

## 2015-05-20 DIAGNOSIS — B373 Candidiasis of vulva and vagina: Secondary | ICD-10-CM

## 2015-05-20 MED ORDER — FLUCONAZOLE 150 MG PO TABS: 150.0000 mg | ORAL_TABLET | Freq: Once | ORAL | Status: DC

## 2015-05-20 NOTE — ED Provider Notes (Signed)
CSN: [id number]     Arrival date & time 05/20/15  1813 History   First MD Initiated Contact with Patient 05/20/15 1855     Chief Complaint  Patient presents with  . Vaginal Discharge      HPI Comments:  Patient developed a whitish pruritic vaginal discharge one week ago that has persisted.  She has not taken antibiotics recently.  She denies abdominal pain.  No urinary symptoms.  No fevers, chills, and sweats.  Patient's last menstrual period was 05/03/2015.  She has a history of BV  Patient is a 44 y.o. female presenting with vaginal discharge. The history is provided by the patient.  Vaginal Discharge Quality:  Watery and white Severity:  Mild Onset quality:  Gradual Duration:  1 week Timing:  Constant Progression:  Unchanged Chronicity:  Recurrent Context: at rest and during intercourse   Context: not recent antibiotic use   Relieved by:  None tried Worsened by:  Nothing tried Ineffective treatments:  None tried Associated symptoms: vaginal itching   Associated symptoms: no abdominal pain, no dyspareunia, no dysuria, no fever, no genital lesions, no nausea, no rash, no urinary frequency, no urinary hesitancy and no urinary incontinence   Risk factors: no STI exposure     Past Medical History  Diagnosis Date  . H/O varicella   . Abnormal Pap smear     cells frozen  . BV (bacterial vaginosis) 2004  . Trichomonas vaginitis 2006   Past Surgical History  Procedure Laterality Date  . Sphincterotomy  07/2009  . Cesarean section      x 1   Family History  Problem Relation Age of Onset  . Asthma Mother   . Hypertension Other    History  Substance Use Topics  . Smoking status: Never Smoker   . Smokeless tobacco: Never Used  . Alcohol Use: No   OB History    Gravida Para Term Preterm AB TAB SAB Ectopic Multiple Living   Review of Systems  Constitutional: Negative for fever.  Gastrointestinal: Negative for nausea and abdominal pain.   Genitourinary: Positive for vaginal discharge. Negative for bladder incontinence, dysuria, hesitancy and dyspareunia.  All other systems reviewed and are negative.   Allergies  Review of patient's allergies indicates no known allergies.  Home Medications   Prior to Admission medications   Medication Sig Start Date End Date Taking? Authorizing Provider  fluconazole (DIFLUCAN) 150 MG tablet Take 1 tablet (150 mg total) by mouth once. May repeat in 72 hours. 05/20/15   Lattie Haw, MD  hydrOXYzine (ATARAX/VISTARIL) 25 MG tablet Take one or two tabs by mouth every 6 to 8 hours as needed for itching 09/05/13   Lattie Haw, MD  medroxyPROGESTERone (DEPO-PROVERA) 150 MG/ML injection Inject 1 mL (150 mg total) into the muscle once. 01/08/13   Elmira Powell, PA-C   BP 112/75 mmHg  Pulse 85  Temp(Src) 98.2 F (36.8 C) (Oral)  Resp 16  Ht  (1.753 m)  Wt 185 lb (83.915 kg)  BMI 27.31 kg/m2  SpO2 100%  LMP 05/03/2015 Physical Exam Nursing notes and Vital Signs reviewed. Appearance:  Patient appears stated age, and in no acute distress Eyes:  Pupils are equal, round, and reactive to light and accomodation.  Extraocular movement is intact.  Conjunctivae are not inflamed  Nose:  Normal turbinates.  No sinus tenderness.    Pharynx:  Normal Neck:  Supple.  No adenopathy Lungs:  Clear to auscultation.  Breath sounds are equal.  Moving air well. Heart:  Regular rate and rhythm without murmurs, rubs, or gallops.  Abdomen:  Nontender without masses or hepatosplenomegaly.  Bowel sounds are present.  No CVA or flank tenderness.  Extremities:  No edema.  No calf tenderness Skin:  No rash present.   ED Course  Procedures  None    Labs Reviewed  GC/CHLAMYDIA PROBE AMP, GENITAL POCT KOH/Wet Prep:   Positive branching yeast; no trich; no WBC; no clue cells; adequate epithelial cells      MDM   1. Candida vaginitis    GC/chlamydia pending. Begin Diflucan 150mg  (two doses) Try  using Fem-Dophilus (by [place]) probiotic for prevention of bacterial vaginosis. Followup with Family Doctor if not improved in one week.     Lattie HawStephen A Zuriel Roskos, MD 05/25/15 [phone]

## 2015-05-20 NOTE — Discharge Instructions (Signed)
Try using Fem-Dophilus (by [employer]) probiotic for prevention of bacterial vaginosis.   Monilial Vaginitis Vaginitis in a soreness, swelling and redness (inflammation) of the vagina and vulva. Monilial vaginitis is not a sexually transmitted infection. CAUSES  Yeast vaginitis is caused by yeast (candida) that is normally found in your vagina. With a yeast infection, the candida has overgrown in number to a point that upsets the chemical balance. SYMPTOMS   White, thick vaginal discharge.  Swelling, itching, redness and irritation of the vagina and possibly the lips of the vagina (vulva).  Burning or painful urination.  Painful intercourse. DIAGNOSIS  Things that may contribute to monilial vaginitis are:  Postmenopausal and virginal states.  Pregnancy.  Infections.  Being tired, sick or stressed, especially if you had monilial vaginitis in the past.  Diabetes. Good control will help lower the chance.  Birth control pills.  Tight fitting garments.  Using bubble bath, feminine sprays, douches or deodorant tampons.  Taking certain medications that kill germs (antibiotics).  Sporadic recurrence can occur if you become ill. TREATMENT  Your caregiver will give you medication.  There are several kinds of anti monilial vaginal creams and suppositories specific for monilial vaginitis. For recurrent yeast infections, use a suppository or cream in the vagina 2 times a week, or as directed.  Anti-monilial or steroid cream for the itching or irritation of the vulva may also be used. Get your caregiver's permission.  Painting the vagina with methylene blue solution may help if the monilial cream does not work.  Eating yogurt may help prevent monilial vaginitis. HOME CARE INSTRUCTIONS   Finish all medication as prescribed.  Do not have sex until treatment is completed or after your caregiver tells you it is okay.  Take warm sitz baths.  Do not douche.  Do not use tampons,  especially scented ones.  Wear cotton underwear.  Avoid tight pants and panty hose.  Tell your sexual partner that you have a yeast infection. They should go to their caregiver if they have symptoms such as mild rash or itching.  Your sexual partner should be treated as well if your infection is difficult to eliminate.  Practice safer sex. Use condoms.  Some vaginal medications cause latex condoms to fail. Vaginal medications that harm condoms are:  Cleocin cream.  Butoconazole (Femstat).  Terconazole (Terazol) vaginal suppository.  Miconazole (Monistat) (may be purchased over the counter). SEEK MEDICAL CARE IF:   You have a temperature by mouth above 102 F (38.9 C).  The infection is getting worse after 2 days of treatment.  The infection is not getting better after 3 days of treatment.  You develop blisters in or around your vagina.  You develop vaginal bleeding, and it is not your menstrual period.  You have pain when you urinate.  You develop intestinal problems.  You have pain with sexual intercourse. Document Released: 07/28/2005 Document Revised: 01/10/2012 Document Reviewed: 04/11/2009 [facility] Patient Information 2015 [employer], [place]. This information is not intended to replace advice given to you by your health care provider. Make sure you discuss any questions you have with your health care provider.

## 2015-05-20 NOTE — ED Notes (Addendum)
Pt c/o white vaginal d/c x 1 wk. Denies fever, abd pain or urinary s/s. History of BV and yeast.

## 2015-05-21 LAB — GC/CHLAMYDIA PROBE AMP
CT Probe RNA: NEGATIVE
GC Probe RNA: NEGATIVE

## 2015-05-22 ENCOUNTER — Telehealth: Payer: Self-pay | Admitting: Emergency Medicine

## 2015-06-18 ENCOUNTER — Encounter: Payer: Self-pay | Admitting: Emergency Medicine

## 2015-06-18 ENCOUNTER — Emergency Department (INDEPENDENT_AMBULATORY_CARE_PROVIDER_SITE_OTHER)
Admission: EM | Admit: 2015-06-18 | Discharge: 2015-06-18 | Disposition: A | Discharge status: Home / Self Care | Payer: Federal, State, Local not specified - PPO | Source: Home / Self Care | Attending: Family Medicine | Admitting: Family Medicine

## 2015-06-18 DIAGNOSIS — R300 Dysuria: Secondary | ICD-10-CM

## 2015-06-18 DIAGNOSIS — R358 Other polyuria: Secondary | ICD-10-CM

## 2015-06-18 DIAGNOSIS — R3589 Other polyuria: Secondary | ICD-10-CM

## 2015-06-18 MED ORDER — CEPHALEXIN 500 MG PO CAPS: 500.0000 mg | ORAL_CAPSULE | Freq: Two times a day (BID) | ORAL | Status: DC

## 2015-06-18 NOTE — Discharge Instructions (Signed)
Please take antibiotics as prescribed and be sure to complete entire course even if you start to feel better to ensure infection does not come back.   Urine Culture Collection, Female  You will collect a sample of pee (urine) in a cup. Read the instructions below before beginning. If you have any questions, ask the nurse before you begin. Follow the instructions carefully. 1. Wash your hands with soap and water and dry them thoroughly. 2. Open the lid of the cup. Be careful not to touch the inside. 3. Clean the private (genital) area.  Sit over the toilet. Use the fingers of one hand to separate and hold open the folds of the skin in your private area.  Clean the pee (urinary) opening and surrounding area with the gauze, wiping from front to back. Throw away the gauze in the trash, not the toilet. Repeat this step two times. 4. With the folds of skin still separated, pee a small amount into toilet. STOP, then pee into the cup. Fill the cup half way. 5. Put the lid on the cup tightly. 6. Wash your hands with soap and water. 7. If you were given a label, put the label on the cup. 8. Give the cup to the nurse. Document Released: 09/30/2008 Document Revised: 10/04/2012 Document Reviewed: 09/30/2008 [facility] Patient Information 2015 [place], [place]. This information is not intended to replace advice given to you by your health care provider. Make sure you discuss any questions you have with your health care provider.

## 2015-06-18 NOTE — ED Provider Notes (Signed)
CSN: [id number]     Arrival date & time 06/18/15  1733 History   First MD Initiated Contact with Patient 06/18/15 1744     Chief Complaint  Patient presents with  . Urinary Tract Infection   (Consider location/radiation/quality/duration/timing/severity/associated sxs/prior Treatment) HPI  The patient is a 44 year old female presenting to urgent care with complaints of sudden onset urinary frequency, urgency and pain with urination started yesterday.  However, she does report history of chills and mild body aches 1 week ago without any other symptoms including cough or congestion.  Patient also complaining of lower abdominal pressure sensation.  Pain is mild in severity worse when she urinates.  Patient does report recent intercourse.  Denies vaginal symptoms.  She did take over-the-counter Azo yesterday with moderate relief.  Denies history of prior UTIs.  Denies history of kidney stones.  Patient does have history of gallstones but did have her gallbladder removed 1 year ago without  complications.  Past Medical History  Diagnosis Date  . H/O varicella   . Abnormal Pap smear     cells frozen  . BV (bacterial vaginosis) 2004  . Trichomonas vaginitis 2006   Past Surgical History  Procedure Laterality Date  . Sphincterotomy  07/2009  . Cesarean section      x 1   Family History  Problem Relation Age of Onset  . Asthma Mother   . Hypertension Other    Social History  Substance Use Topics  . Smoking status: Never Smoker   . Smokeless tobacco: Never Used  . Alcohol Use: No   OB History    Gravida Para Term Preterm AB TAB SAB Ectopic Multiple Living   3 1 1  2     1      Review of Systems  Constitutional: Positive for chills. Negative for fever.  HENT: Negative for congestion, ear pain, sore throat, trouble swallowing and voice change.   Respiratory: Negative for cough and shortness of breath.   Cardiovascular: Negative for chest pain and palpitations.  Gastrointestinal:  Negative for nausea, vomiting, abdominal pain and diarrhea.  Genitourinary: Positive for dysuria, urgency, frequency, hematuria, decreased urine volume and pelvic pain. Negative for flank pain, vaginal bleeding, vaginal discharge, vaginal pain and menstrual problem.  Musculoskeletal: Negative for myalgias, back pain and arthralgias.  Skin: Negative for rash.    Allergies  Review of patient's allergies indicates no known allergies.  Home Medications   Prior to Admission medications   Medication Sig Start Date End Date Taking? Authorizing Provider  cephALEXin (KEFLEX) 500 MG capsule Take 1 capsule (500 mg total) by mouth 2 (two) times daily. 06/18/15   Junius Finner, PA-C   BP 130/80 mmHg  Pulse 88  Temp(Src) 98.1 F (36.7 C) (Oral)  Ht 5\' 9"  (1.753 m)  Wt 187 lb (84.823 kg)  BMI 27.60 kg/m2  SpO2 98%  LMP 05/03/2015 (LMP Unknown) Physical Exam  Constitutional: She appears well-developed and well-nourished. No distress.  HENT:  Head: Normocephalic and atraumatic.  Eyes: Conjunctivae are normal. No scleral icterus.  Neck: Normal range of motion.  Cardiovascular: Normal rate, regular rhythm and normal heart sounds.   Pulmonary/Chest: Effort normal and breath sounds normal. No respiratory distress. She has no wheezes. She has no rales. She exhibits no tenderness.  Abdominal: Soft. Bowel sounds are normal. She exhibits no distension and no mass. There is no tenderness. There is no rebound and no guarding.  Soft, non-distended, non-tender. No CVAT  Musculoskeletal: Normal range of motion.  Neurological: She  is alert.  Skin: Skin is warm and dry. She is not diaphoretic.  Nursing note and vitals reviewed.   ED Course  Procedures (including critical care time) Labs Review Labs Reviewed  URINE CULTURE    Imaging Review No results found.   MDM   1. Dysuria   2. Polyuria    Patient complaining of urinary symptoms consistent with UTI.  No history of prior.  No evidence of  pyelonephritis.  No history of kidney stones.  Patient denies vaginal symptoms.  Abdomen is soft, nontender.   Patient did take Azo prior to arrival, which will likely skew test results.  UA was not performed in urgent care.  However, urine culture was sent off. Due to history and symptoms consistent with UTI, will start patient on Keflex and will notify patient of culture results to let pt know if antibiotics can be discontinued and further evaluation of symptoms needs to take place, if pt needs to change antibiotic, or if she may continue on current Keflex. Pt may continue Azo for discomfort. Encouraged to stay well hydrated. F/u in 1 week with PCP if symptoms not improving. Patient verbalized understanding and agreement with treatment plan.    Junius Finner, PA-C 06/18/15 1820

## 2015-06-18 NOTE — ED Notes (Signed)
Pt c/o pressure, frequency, urgency, dysuria for 1 day.  Pt has taken OTC AZO with some relief.

## 2015-06-21 ENCOUNTER — Telehealth: Payer: Self-pay | Admitting: Emergency Medicine

## 2015-06-21 LAB — URINE CULTURE: Colony Count: >=100000

## 2015-08-12 ENCOUNTER — Encounter: Payer: Self-pay | Admitting: *Deleted

## 2015-08-12 ENCOUNTER — Emergency Department
Admission: EM | Admit: 2015-08-12 | Discharge: 2015-08-12 | Disposition: A | Discharge status: Home / Self Care | Payer: Federal, State, Local not specified - PPO | Source: Home / Self Care | Attending: Family Medicine | Admitting: Family Medicine

## 2015-08-12 DIAGNOSIS — A499 Bacterial infection, unspecified: Secondary | ICD-10-CM | POA: Diagnosis not present

## 2015-08-12 DIAGNOSIS — B9689 Other specified bacterial agents as the cause of diseases classified elsewhere: Secondary | ICD-10-CM

## 2015-08-12 DIAGNOSIS — N760 Acute vaginitis: Secondary | ICD-10-CM | POA: Diagnosis not present

## 2015-08-12 LAB — POCT URINALYSIS DIP (MANUAL ENTRY)
Bilirubin, UA: NEGATIVE
GLUCOSE UA: NEGATIVE
Ketones, POC UA: NEGATIVE
Leukocytes, UA: NEGATIVE
NITRITE UA: NEGATIVE
Protein Ur, POC: NEGATIVE
Spec Grav, UA: >=1.03 (ref 1.005–1.03)
UROBILINOGEN UA: 0.2 (ref 0–1)
pH, UA: 5.5 (ref 5–8)

## 2015-08-12 MED ORDER — FLUCONAZOLE 150 MG PO TABS: 150.0000 mg | ORAL_TABLET | Freq: Once | ORAL | Status: DC

## 2015-08-12 MED ORDER — TINIDAZOLE 500 MG PO TABS: ORAL_TABLET | ORAL | Status: DC

## 2015-08-12 NOTE — ED Provider Notes (Signed)
CSN: [id number]     Arrival date & time 08/12/15  1723 History   First MD Initiated Contact with Patient 08/12/15 1807     Chief Complaint  Patient presents with  . Vaginal Discharge      HPI Comments: Patient complains of malodorous vaginal discharge with fishy odor for two weeks without pelvic pain, fever, dysuria, or nausea.  She tried using Metrogel vaginal two weeks ago without improvement.  Her last menstrual period was 3 months ago (she uses contraceptive patch).  Patient is a 44 y.o. female presenting with vaginal discharge. The history is provided by the patient.  Vaginal Discharge Quality:  White Severity:  Moderate Onset quality:  Gradual Duration:  2 weeks Timing:  Intermittent Progression:  Unchanged Chronicity:  Recurrent Context: not recent antibiotic use   Relieved by:  Nothing Worsened by:  Nothing tried Ineffective treatments: Metrogel vaginal. Associated symptoms: no abdominal pain, no dyspareunia, no dysuria, no fever, no genital lesions, no nausea, no rash, no urinary frequency, no urinary hesitancy, no urinary incontinence and no vomiting     Past Medical History  Diagnosis Date  . H/O varicella   . Abnormal Pap smear     cells frozen  . BV (bacterial vaginosis) 2004  . Trichomonas vaginitis 2006   Past Surgical History  Procedure Laterality Date  . Sphincterotomy  07/2009  . Cesarean section      x 1   Family History  Problem Relation Age of Onset  . Asthma Mother   . Hypertension Other    Social History  Substance Use Topics  . Smoking status: Never Smoker   . Smokeless tobacco: Never Used  . Alcohol Use: No   OB History    Gravida Para Term Preterm AB TAB SAB Ectopic Multiple Living   Review of Systems  Constitutional: Negative for fever.  Gastrointestinal: Negative for nausea, vomiting and abdominal pain.  Genitourinary: Positive for vaginal discharge. Negative for bladder incontinence, dysuria, hesitancy and  dyspareunia.  All other systems reviewed and are negative.   Allergies  Review of patient's allergies indicates no known allergies.  Home Medications   Prior to Admission medications   Medication Sig Start Date End Date Taking? Authorizing Provider  UNKNOWN TO PATIENT    Yes Historical Provider, MD  fluconazole (DIFLUCAN) 150 MG tablet Take 1 tablet (150 mg total) by mouth once. May repeat in 72 hours. 08/12/15   Lattie Haw, MD  tinidazole (TINDAMAX) 500 MG tablet Take two tabs once daily for five days.  Take with food 08/12/15   Lattie Haw, MD   Meds Ordered and Administered this Visit  Medications - No data to display  BP 111/79 mmHg  Pulse 84  Temp(Src) 98.4 F (36.9 C) (Oral)  Resp 16  Ht  (1.753 m)  Wt 185 lb (83.915 kg)  BMI 27.31 kg/m2  SpO2 99% No data found.   Physical Exam Nursing notes and Vital Signs reviewed. Appearance:  Patient appears stated age, and in no acute distress Eyes:  Pupils are equal, round, and reactive to light and accomodation.  Extraocular movement is intact.  Conjunctivae are not inflamed  Nose:  Normal  Pharynx:  Normal Neck:  Supple. No adenopathy Lungs:  Clear to auscultation.  Breath sounds are equal.  Moving air well. Heart:  Regular rate and rhythm without murmurs, rubs, or gallops.  Abdomen:  Nontender without masses or  hepatosplenomegaly.  Bowel sounds are present.  No CVA or flank tenderness.  Extremities:  No edema.  No calf tenderness Skin:  No rash present.   ED Course  Procedures  None    Labs Reviewed  POCT URINALYSIS DIP (MANUAL ENTRY) - Abnormal; Notable for the following:    Blood, UA trace-intact (*)    All other components within normal limits POCT KOH/Wet prep:  No yeast, now WBC, adequate epith cells, clue cells present; no trich, few bacteria.      MDM   1. Bacterial vaginosis    Begin tinidazole , two daily for five days. Rx for Diflucan at patient's request. Recommend Fem-Dophilus  probiotic Followup with GYN if not improving    Lattie Haw, MD 08/16/15 2120

## 2015-08-12 NOTE — ED Notes (Signed)
Pt c/o white vaginal discharge with odor x 2 wks. She has used [employer] [facility].

## 2015-08-12 NOTE — Discharge Instructions (Signed)
Recommend Fem-Dophilus probiotic

## 2015-08-18 ENCOUNTER — Telehealth: Payer: Self-pay | Admitting: *Deleted

## 2016-01-12 ENCOUNTER — Encounter: Payer: Self-pay | Admitting: *Deleted

## 2016-01-12 ENCOUNTER — Other Ambulatory Visit: Payer: Self-pay | Admitting: Family Medicine

## 2016-01-12 ENCOUNTER — Emergency Department
Admission: EM | Admit: 2016-01-12 | Discharge: 2016-01-12 | Disposition: A | Discharge status: Home / Self Care | Payer: Federal, State, Local not specified - PPO | Source: Home / Self Care | Attending: Family Medicine | Admitting: Family Medicine

## 2016-01-12 DIAGNOSIS — A499 Bacterial infection, unspecified: Secondary | ICD-10-CM

## 2016-01-12 DIAGNOSIS — N898 Other specified noninflammatory disorders of vagina: Secondary | ICD-10-CM

## 2016-01-12 DIAGNOSIS — B9689 Other specified bacterial agents as the cause of diseases classified elsewhere: Secondary | ICD-10-CM

## 2016-01-12 DIAGNOSIS — N760 Acute vaginitis: Secondary | ICD-10-CM | POA: Diagnosis not present

## 2016-01-12 MED ORDER — TINIDAZOLE 500 MG PO TABS: ORAL_TABLET | ORAL | Status: AC

## 2016-01-12 NOTE — ED Provider Notes (Signed)
CSN: [id number]     Arrival date & time 01/12/16  1737 History   First MD Initiated Contact with Patient 01/12/16 1756     Chief Complaint  Patient presents with  . Vaginal Discharge      HPI Comments: Patient complains of one month history of recurrent vaginal discharge with "fishy" odor.  No pelvic or vaginal pain.  No nausea/vomiting.  No fevers, chills, and sweats.  Patient uses contraceptive patch.  Patient is a 45 y.o. female presenting with vaginal discharge. The history is provided by the patient.  Vaginal Discharge Quality:  Watery and malodorous Severity:  Mild Onset quality:  Gradual Duration:  1 month Timing:  Constant Progression:  Unchanged Chronicity:  Recurrent Relieved by:  Nothing Worsened by:  Nothing tried Ineffective treatments:  None tried Associated symptoms: no abdominal pain, no dysuria, no fever, no genital lesions, no nausea, no rash, no urinary frequency, no urinary hesitancy, no urinary incontinence, no vaginal itching and no vomiting   Risk factors: no new sexual partner and no STI exposure     Past Medical History  Diagnosis Date  . H/O varicella   . Abnormal Pap smear     cells frozen  . BV (bacterial vaginosis) 2004  . Trichomonas vaginitis 2006   Past Surgical History  Procedure Laterality Date  . Sphincterotomy  07/2009  . Cesarean section      x 1   Family History  Problem Relation Age of Onset  . Asthma Mother   . Hypertension Other    Social History  Substance Use Topics  . Smoking status: Never Smoker   . Smokeless tobacco: Never Used  . Alcohol Use: No   OB History    Gravida Para Term Preterm AB TAB SAB Ectopic Multiple Living   3 1 1  2     1      Review of Systems  Constitutional: Negative for fever.  Gastrointestinal: Negative for nausea, vomiting and abdominal pain.  Genitourinary: Positive for vaginal discharge. Negative for bladder incontinence, dysuria and hesitancy.  All other systems reviewed and are  negative.   Allergies  Review of patient's allergies indicates no known allergies.  Home Medications   Prior to Admission medications   Medication Sig Start Date End Date Taking? Authorizing Provider  tinidazole (TINDAMAX) 500 MG tablet Take two tabs by mouth once daily for five days. 01/12/16   Lattie HawStephen A Beese, MD  UNKNOWN TO PATIENT     Historical Provider, MD   Meds Ordered and Administered this Visit  Medications - No data to display  BP 178/78 mmHg  Pulse 83  Temp(Src) 98.1 F (36.7 C) (Oral)  Resp 16  Ht 5\' 9"  (1.753 m)  Wt 185 lb (83.915 kg)  BMI 27.31 kg/m2  SpO2 100% No data found.   Physical Exam Nursing notes and Vital Signs reviewed. Appearance:  Patient appears stated age, and in no acute distress Eyes:  Pupils are equal, round, and reactive to light and accomodation.  Extraocular movement is intact.  Conjunctivae are not inflamed  Nose:  Normal Pharynx:  Normal Neck:  Supple.   No adenopathy Lungs:  Clear to auscultation.  Breath sounds are equal.  Moving air well. Heart:  Regular rate and rhythm without murmurs, rubs, or gallops.  Abdomen:  Nontender without masses or hepatosplenomegaly.  Bowel sounds are present.  No CVA or flank tenderness.  Extremities:  No edema.  Skin:  No rash present. Pelvic exam deferred;  Patient instructed in  obtaining self vaginal specimen   ED Course  Procedures none    Labs Reviewed  GC/CHLAMYDIA PROBE AMP, GENITAL  POCT WET + KOH PREP:  Clue cells present; no WBC, no yeast, no trich     MDM   1. Vaginal discharge   2. Bacterial vaginosis    GC/chlamydia pending. Begin tinidazole , 2 tabs daily for 5 days. Followup with gyn if not resolved one week.                                                                                                                                                                                                               Lattie Haw, MD 01/17/16 [phone]

## 2016-01-12 NOTE — ED Notes (Signed)
Pt c/o "fishy" smelling white vaginal d/c x 1 mth. Hx of BV. Metrogel helps.

## 2016-01-12 NOTE — Discharge Instructions (Signed)

## 2016-01-13 LAB — GC/CHLAMYDIA PROBE AMP
CT Probe RNA: NOT DETECTED
GC PROBE AMP APTIMA: NOT DETECTED

## 2016-01-14 ENCOUNTER — Telehealth: Payer: Self-pay | Admitting: *Deleted

## 2016-01-20 ENCOUNTER — Telehealth: Payer: Self-pay | Admitting: *Deleted

## 2019-12-19 ENCOUNTER — Other Ambulatory Visit: Payer: Self-pay | Admitting: Obstetrics and Gynecology

## 2019-12-19 DIAGNOSIS — R928 Other abnormal and inconclusive findings on diagnostic imaging of breast: Secondary | ICD-10-CM

## 2020-01-01 ENCOUNTER — Ambulatory Visit
Admission: RE | Admit: 2020-01-01 | Discharge: 2020-01-01 | Disposition: A | Discharge status: Home / Self Care | Payer: No Typology Code available for payment source | Source: Ambulatory Visit | Attending: Obstetrics and Gynecology | Admitting: Obstetrics and Gynecology

## 2020-01-01 ENCOUNTER — Ambulatory Visit: Payer: Federal, State, Local not specified - PPO

## 2020-01-01 ENCOUNTER — Other Ambulatory Visit: Payer: Self-pay

## 2020-01-01 DIAGNOSIS — R928 Other abnormal and inconclusive findings on diagnostic imaging of breast: Secondary | ICD-10-CM

## 2022-02-09 ENCOUNTER — Other Ambulatory Visit: Payer: Self-pay | Admitting: Obstetrics and Gynecology

## 2022-02-09 DIAGNOSIS — R928 Other abnormal and inconclusive findings on diagnostic imaging of breast: Secondary | ICD-10-CM

## 2022-02-22 ENCOUNTER — Ambulatory Visit
Admission: RE | Admit: 2022-02-22 | Discharge: 2022-02-22 | Disposition: A | Discharge status: Home / Self Care | Payer: No Typology Code available for payment source | Source: Ambulatory Visit | Attending: Obstetrics and Gynecology | Admitting: Obstetrics and Gynecology

## 2022-02-22 ENCOUNTER — Ambulatory Visit
Admission: RE | Admit: 2022-02-22 | Discharge: 2022-02-22 | Disposition: A | Discharge status: Home / Self Care | Payer: Federal, State, Local not specified - PPO | Source: Ambulatory Visit | Attending: Obstetrics and Gynecology | Admitting: Obstetrics and Gynecology

## 2022-02-22 DIAGNOSIS — R928 Other abnormal and inconclusive findings on diagnostic imaging of breast: Secondary | ICD-10-CM

## 2022-03-05 ENCOUNTER — Ambulatory Visit
Admission: RE | Admit: 2022-03-05 | Discharge: 2022-03-05 | Disposition: A | Discharge status: Home / Self Care | Payer: Federal, State, Local not specified - PPO | Source: Ambulatory Visit | Attending: Obstetrics and Gynecology | Admitting: Obstetrics and Gynecology

## 2022-03-05 ENCOUNTER — Other Ambulatory Visit: Payer: Self-pay | Admitting: Obstetrics and Gynecology

## 2022-03-05 DIAGNOSIS — N6310 Unspecified lump in the right breast, unspecified quadrant: Secondary | ICD-10-CM

## 2022-09-13 ENCOUNTER — Other Ambulatory Visit: Payer: Self-pay | Admitting: Obstetrics and Gynecology

## 2022-09-13 ENCOUNTER — Ambulatory Visit
Admission: RE | Admit: 2022-09-13 | Discharge: 2022-09-13 | Disposition: A | Discharge status: Home / Self Care | Payer: Federal, State, Local not specified - PPO | Source: Ambulatory Visit | Attending: Obstetrics and Gynecology | Admitting: Obstetrics and Gynecology

## 2022-09-13 DIAGNOSIS — N6310 Unspecified lump in the right breast, unspecified quadrant: Secondary | ICD-10-CM

## 2023-02-07 ENCOUNTER — Other Ambulatory Visit: Payer: Federal, State, Local not specified - PPO

## 2023-02-21 ENCOUNTER — Other Ambulatory Visit: Payer: Federal, State, Local not specified - PPO

## 2023-04-04 ENCOUNTER — Other Ambulatory Visit: Payer: Federal, State, Local not specified - PPO

## 2023-04-04 ENCOUNTER — Ambulatory Visit
Admission: RE | Admit: 2023-04-04 | Discharge: 2023-04-04 | Disposition: A | Discharge status: Home / Self Care | Payer: Federal, State, Local not specified - PPO | Source: Ambulatory Visit | Attending: Obstetrics and Gynecology | Admitting: Obstetrics and Gynecology

## 2023-04-04 DIAGNOSIS — N6310 Unspecified lump in the right breast, unspecified quadrant: Secondary | ICD-10-CM

## 2024-01-03 ENCOUNTER — Other Ambulatory Visit: Payer: Self-pay | Admitting: Obstetrics and Gynecology

## 2024-01-03 DIAGNOSIS — N6310 Unspecified lump in the right breast, unspecified quadrant: Secondary | ICD-10-CM

## 2024-02-13 IMAGING — MG MM DIGITAL DIAGNOSTIC UNILAT*R* W/ TOMO W/ CAD
4 series · 4 of 12 positions shown · non-contrast
Comparison: Previous exam(s).

CLINICAL DATA: Screening recall for a right breast asymmetry.

EXAM:
DIGITAL DIAGNOSTIC UNILATERAL RIGHT MAMMOGRAM WITH TOMOSYNTHESIS AND
CAD
TECHNIQUE: Right digital diagnostic mammography and breast tomosynthesis was
performed. The images were evaluated with computer-aided detection.

[R ML synth-2D]
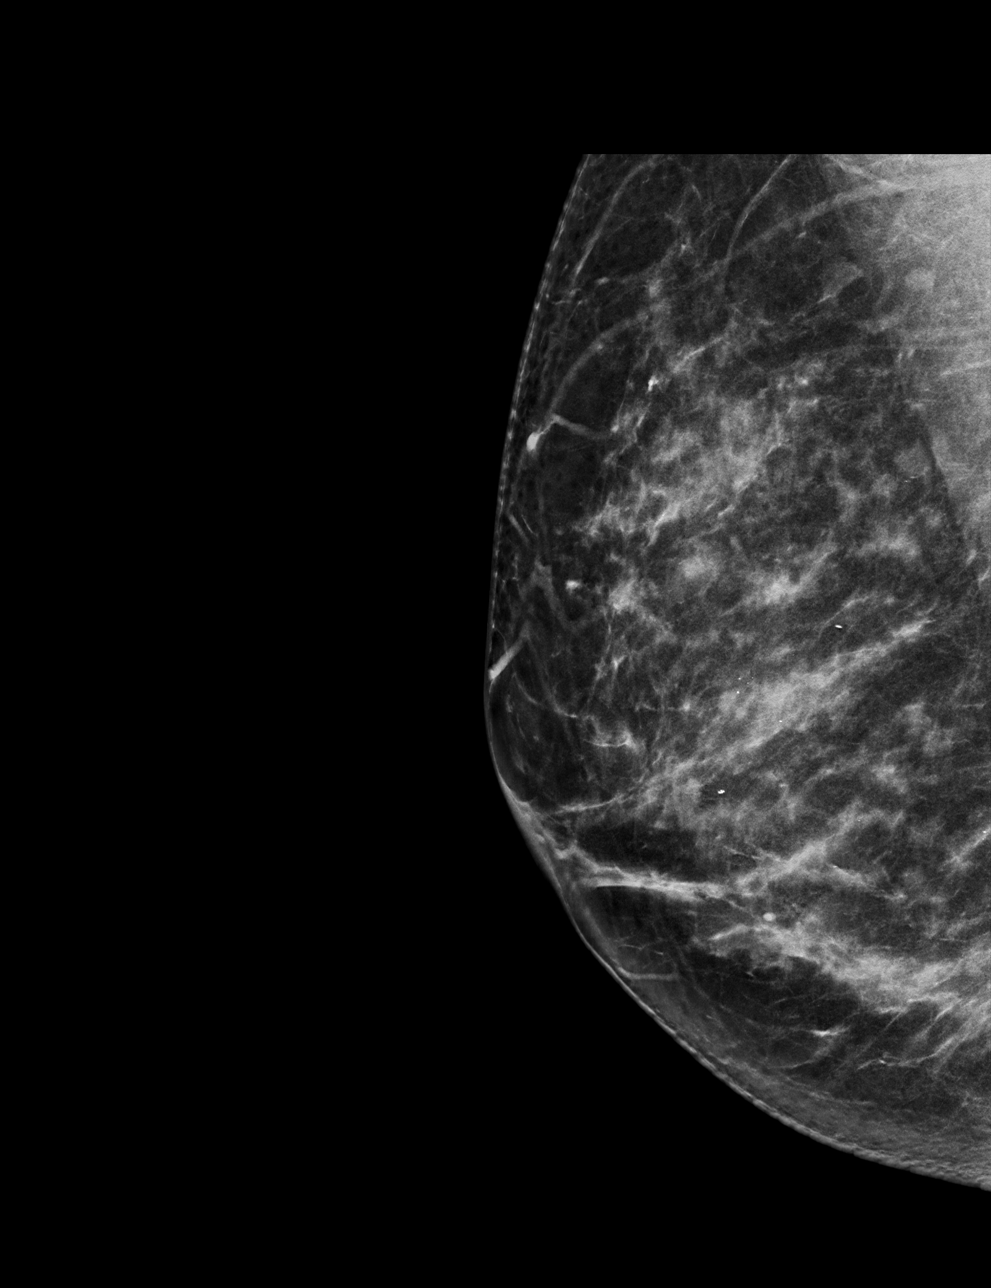

[R CC synth-2D]
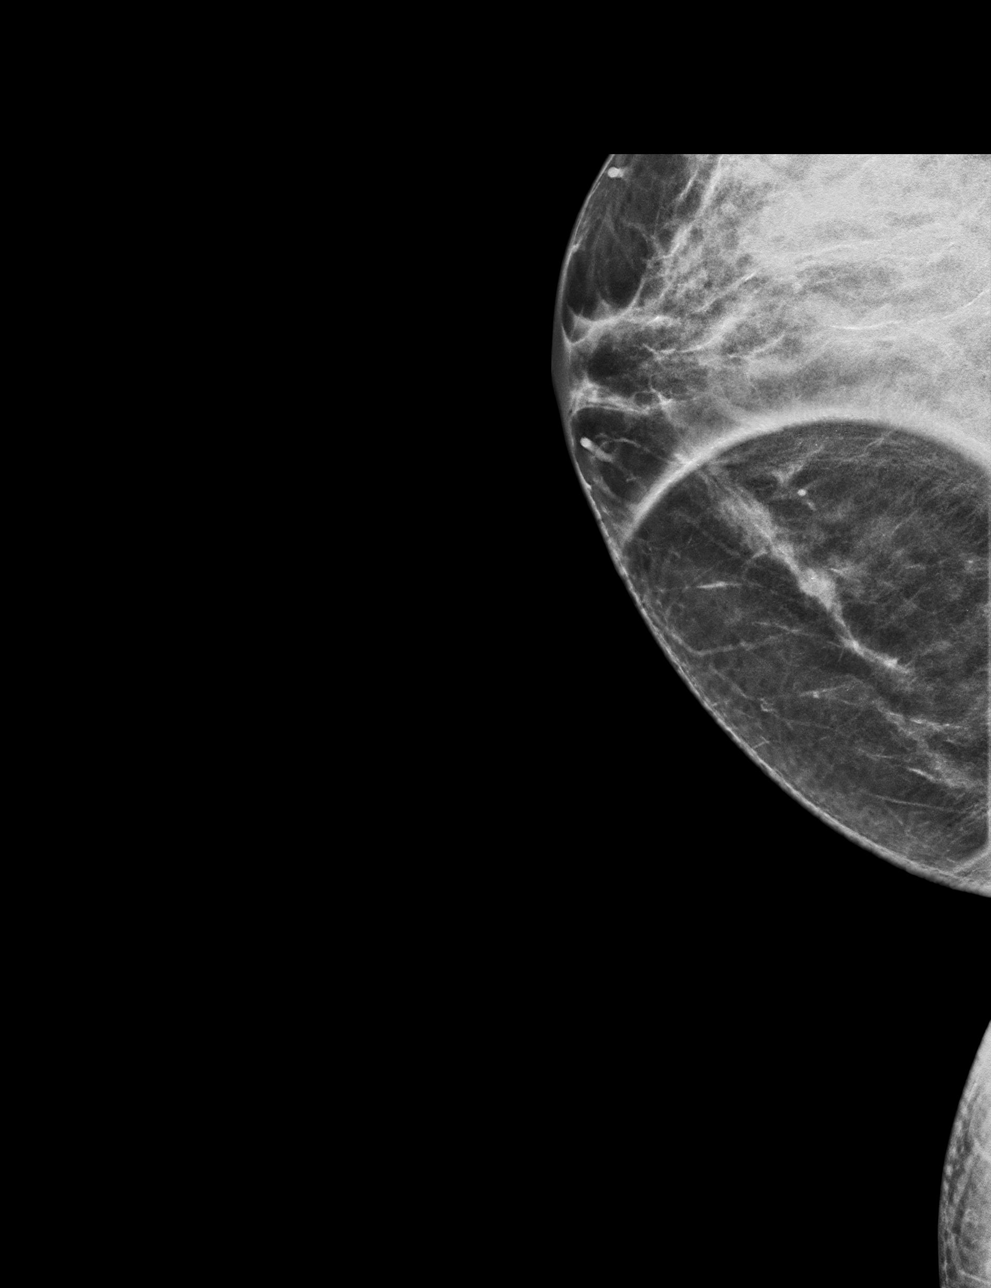

[R ML tomo · tomo slice 35/70.0]
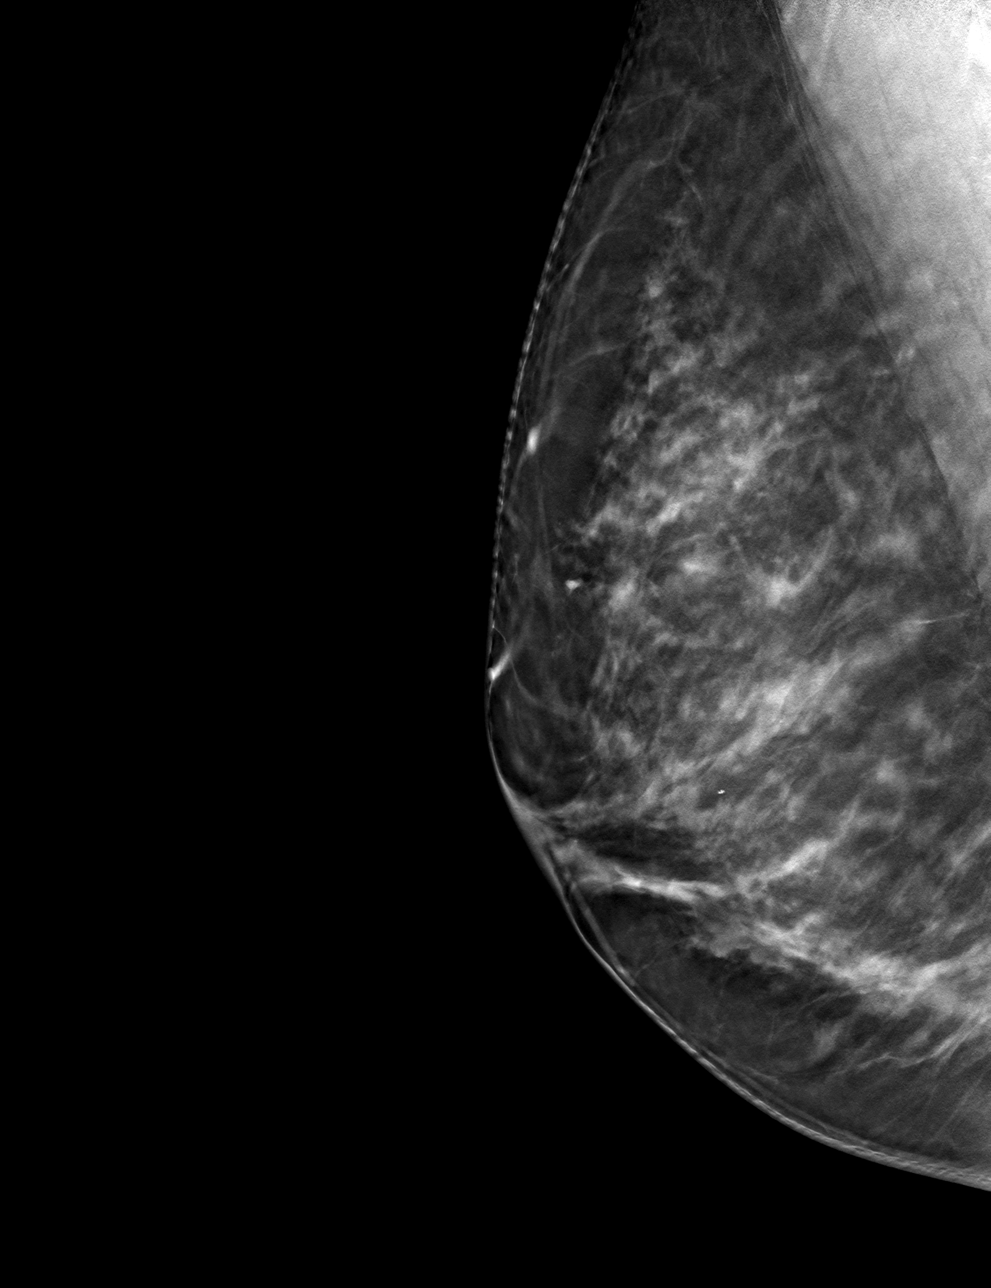

[R CC tomo · tomo slice 29/57.0]
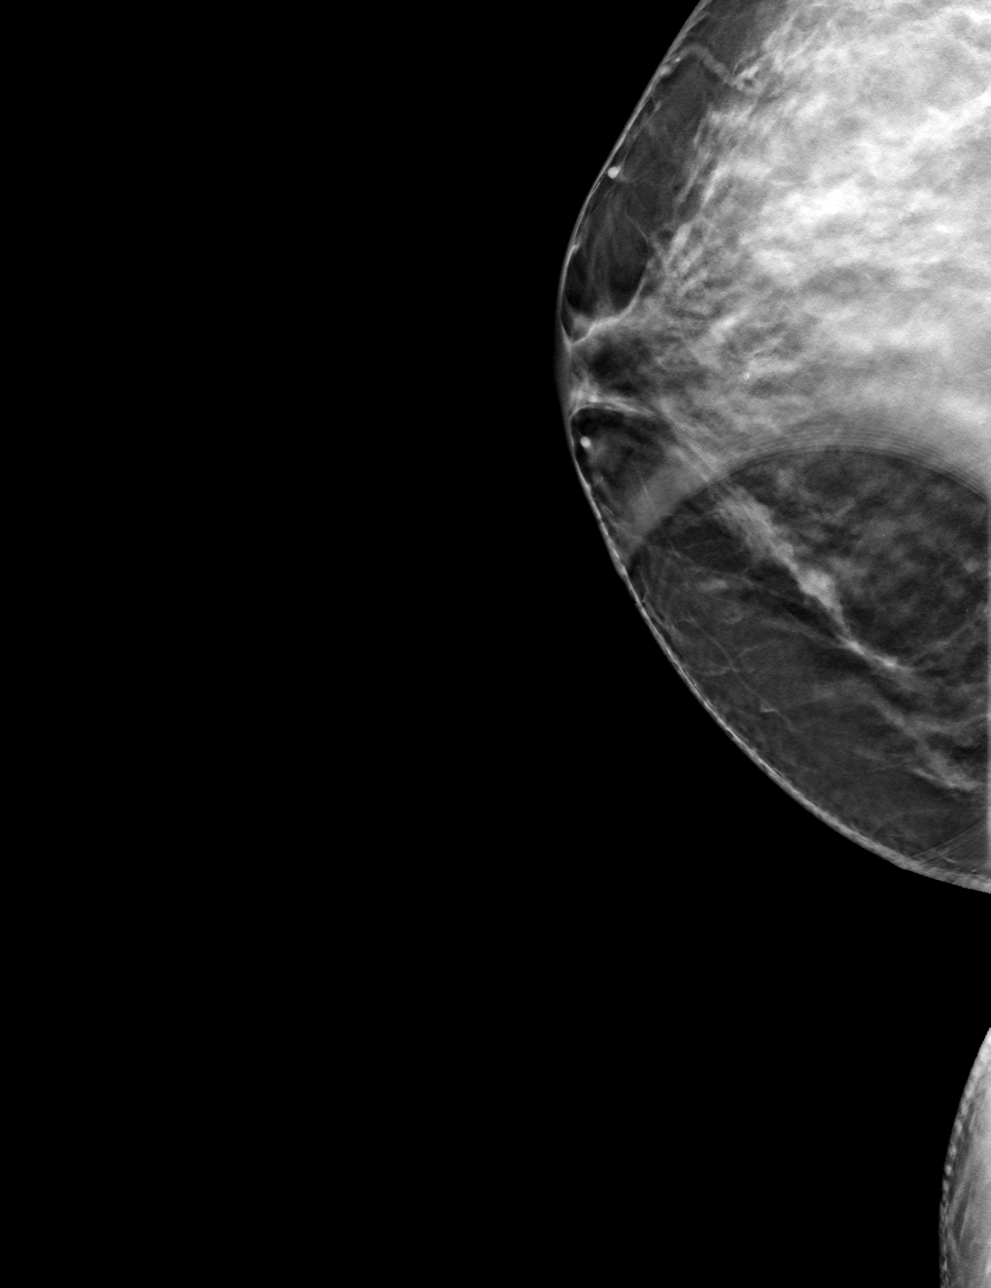

[4 of 12 positions shown; findings below may reference images not displayed]

ACR Breast Density Category c: The breast tissue is heterogeneously
dense, which may obscure small masses.
FINDINGS: Spot compression tomosynthesis images demonstrate a persistent
low-density oval mass measuring approximately 1.2 cm. There is a
possible obscured correlate in the upper inner right breast.
IMPRESSION: Incomplete exam. The patient has a 1.2 cm mass in the upper inner
right breast, but was unable to stay for her ultrasound.

RECOMMENDATION:
Targeted ultrasound of the right breast is recommended.

I have discussed the findings and recommendations with the patient.
If applicable, a reminder letter will be sent to the patient
regarding the next appointment.

BI-RADS CATEGORY  0: Incomplete. Need additional imaging evaluation
and/or prior mammograms for comparison.

## 2024-02-24 IMAGING — US US BREAST*R* LIMITED INC AXILLA
1 series · 9 of 9 positions shown · non-contrast
Comparison: Priors

CLINICAL DATA: Patient returns for ultrasound evaluation of mass
within the upper inner right breast.

EXAM:
ULTRASOUND OF THE RIGHT BREAST

[Series 1: us breast*right* limited inc axilla · 0.07mm/px · 9 of 9 slices shown]
[im 1/9]
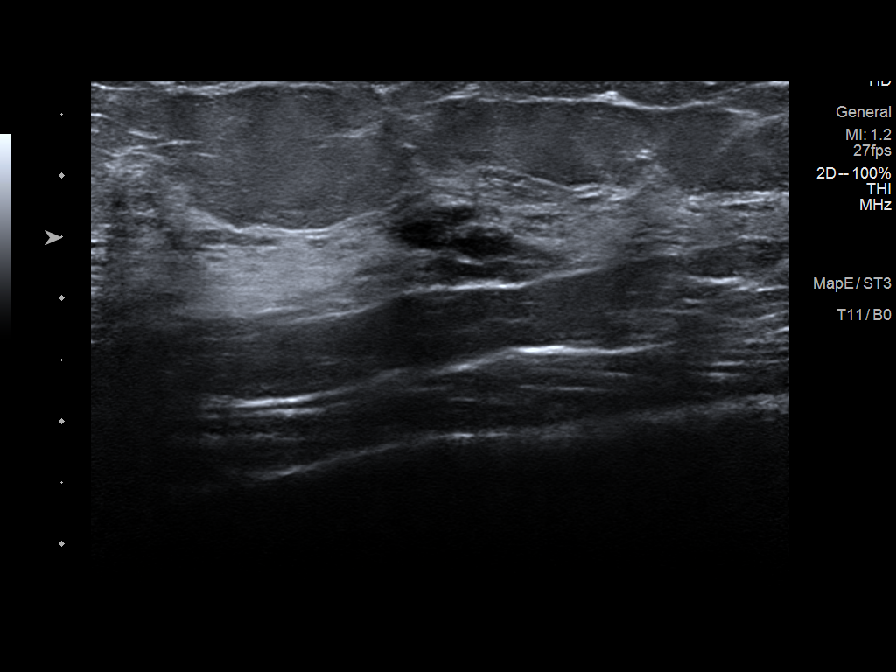
[im 2/9]
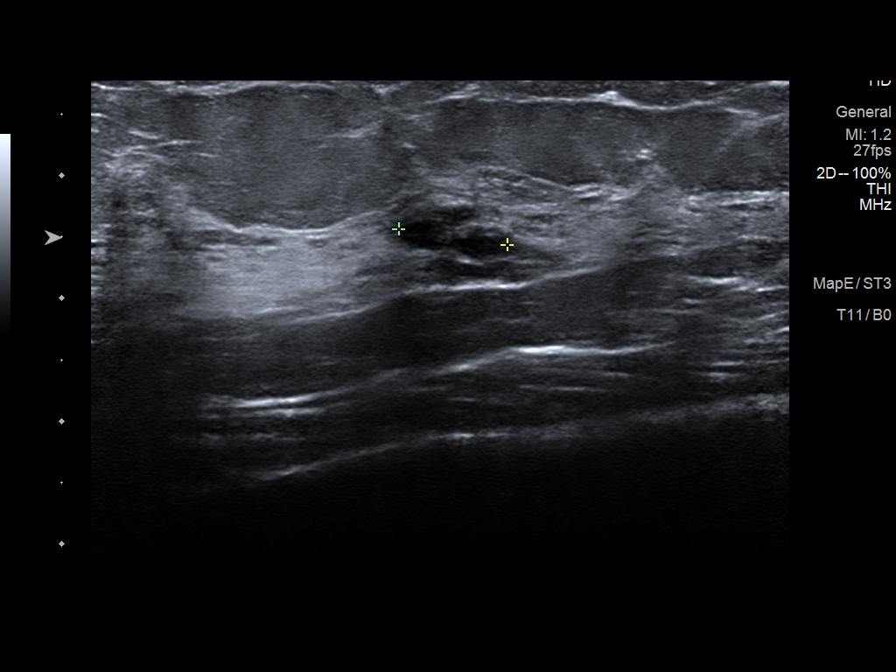
[im 3/9]
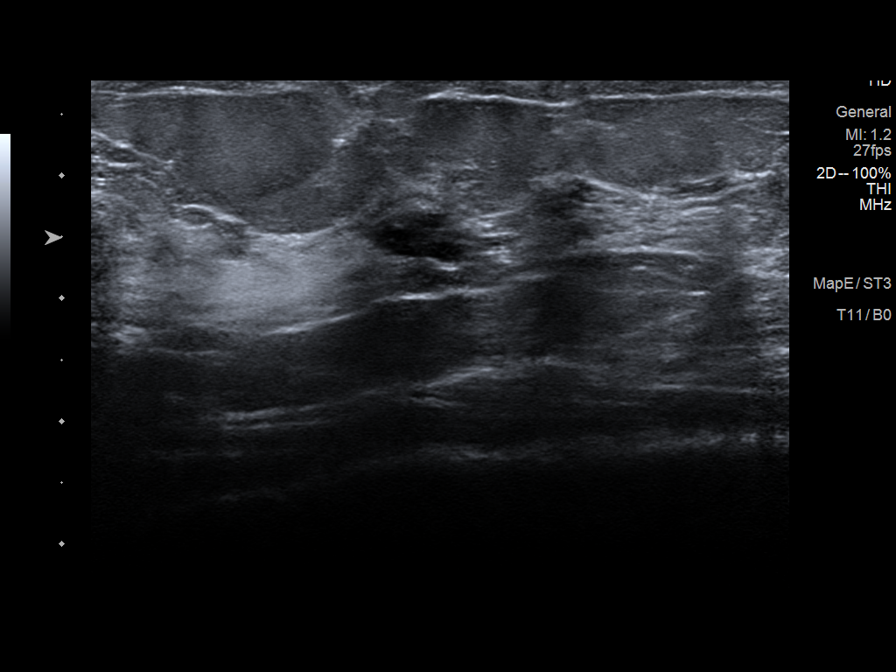
[im 4/9]
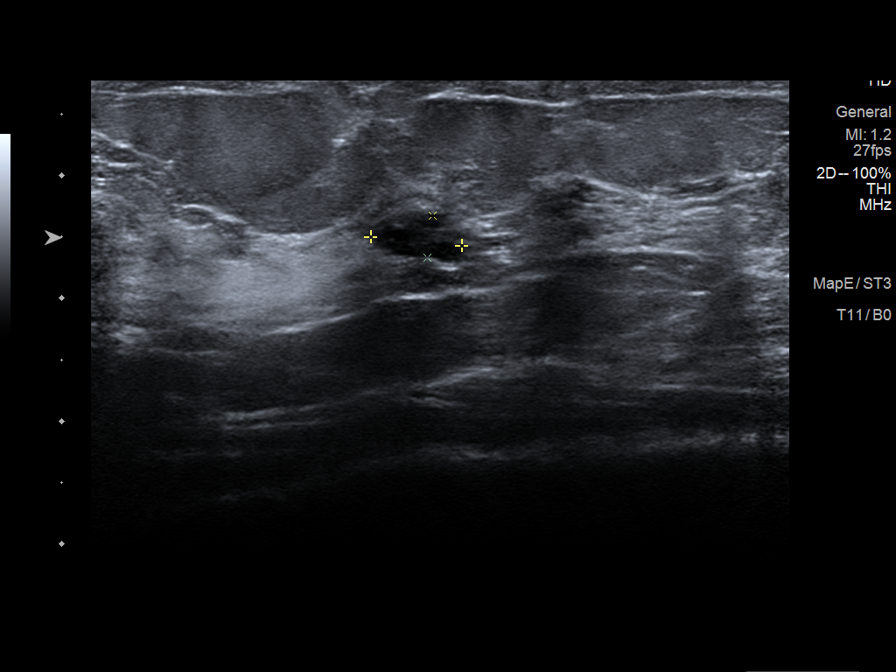
[im 5/9]
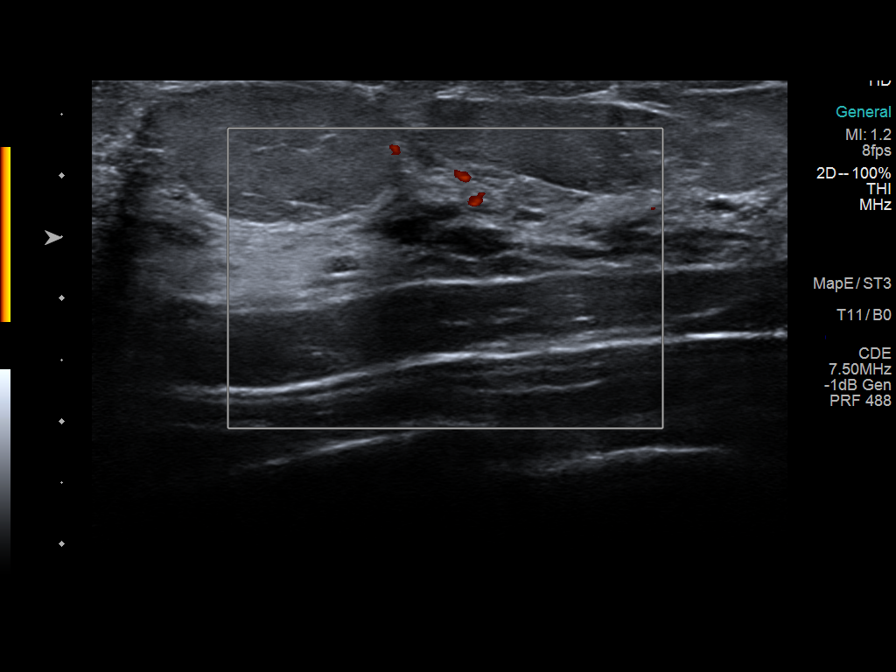
[im 6/9]
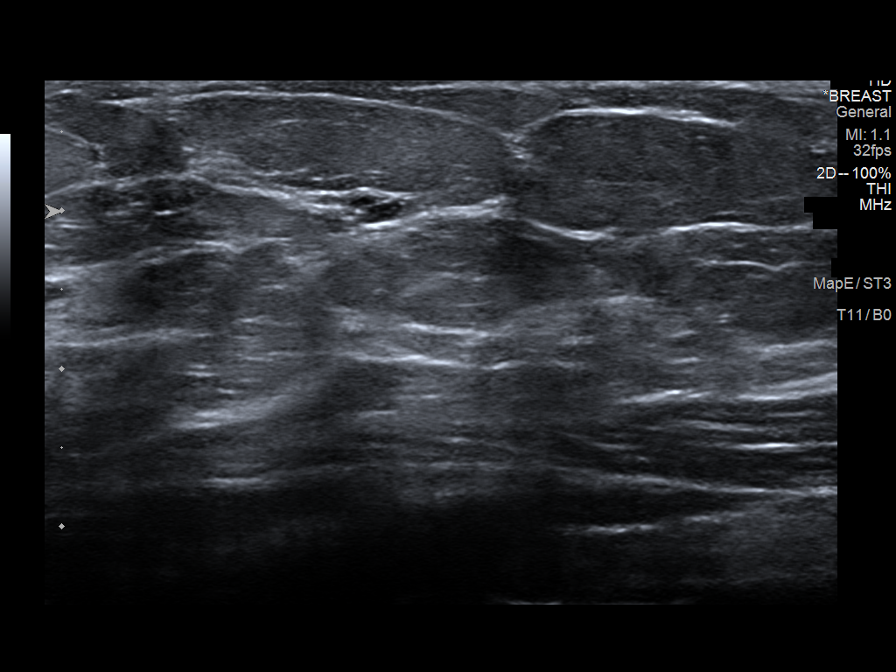
[im 7/9]
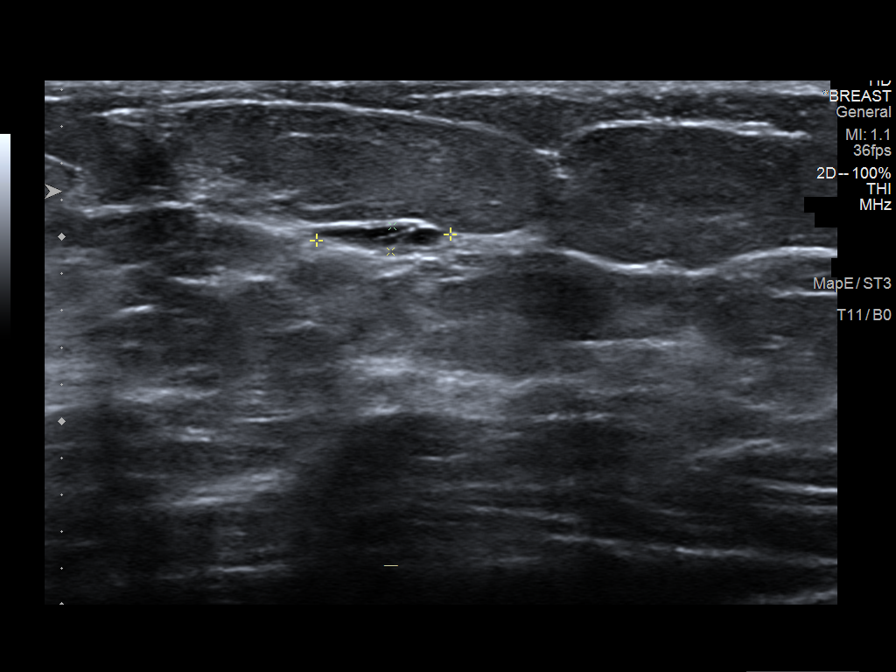
[im 8/9]
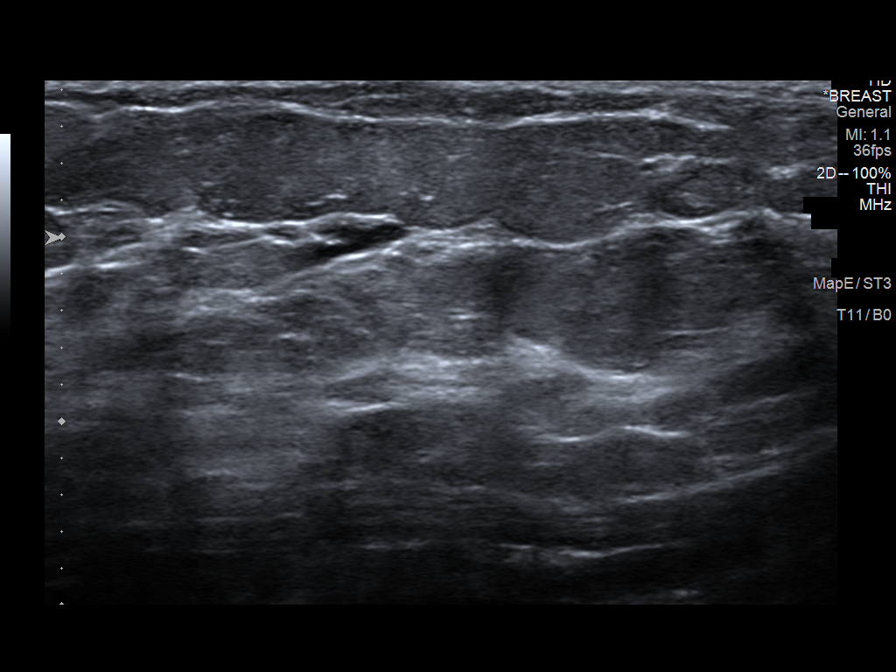
[im 9/9]
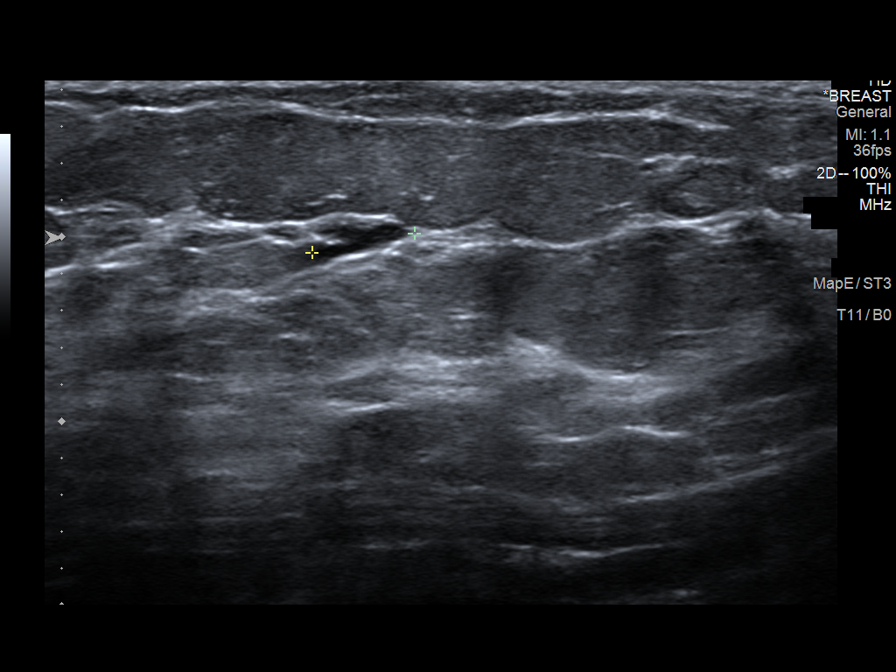

[9 of 9 positions shown; findings below may reference images not displayed]

FINDINGS: Targeted ultrasound is performed, showing a 9 x 7 x 3 mm probable
cluster of cysts right breast 2 o'clock position 3 cm from nipple.
Within the right breast 3 o'clock position 3 cm from nipple there is
a 7 x 2 x 6 mm probable cluster of cysts.
IMPRESSION: There are 2 adjacent probable clusters of cysts within the right
breast, 1 of these is felt to correspond with the mammographic mass.

RECOMMENDATION:
Right breast diagnostic mammogram and ultrasound in 6 months to
reassess the probably benign right breast mass, favored to represent
a cluster of cysts.

I have discussed the findings and recommendations with the patient.
If applicable, a reminder letter will be sent to the patient
regarding the next appointment.

BI-RADS CATEGORY  3: Probably benign.

## 2024-04-23 ENCOUNTER — Encounter

## 2024-04-23 ENCOUNTER — Other Ambulatory Visit

## 2024-05-07 ENCOUNTER — Other Ambulatory Visit

## 2024-05-07 ENCOUNTER — Encounter

## 2024-05-28 ENCOUNTER — Ambulatory Visit
Admission: RE | Admit: 2024-05-28 | Discharge: 2024-05-28 | Disposition: A | Discharge status: Home / Self Care | Source: Ambulatory Visit | Attending: Obstetrics and Gynecology | Admitting: Obstetrics and Gynecology

## 2024-05-28 DIAGNOSIS — N6310 Unspecified lump in the right breast, unspecified quadrant: Secondary | ICD-10-CM
